# Patient Record
Sex: Female | Born: 1940
Health system: Southern US, Community
[De-identification: ages and names within clinical notes are randomized; demographics above are authoritative.]

## PROBLEM LIST (undated history)

## (undated) DIAGNOSIS — F419 Anxiety disorder, unspecified: Secondary | ICD-10-CM

## (undated) DIAGNOSIS — F329 Major depressive disorder, single episode, unspecified: Secondary | ICD-10-CM

## (undated) DIAGNOSIS — E876 Hypokalemia: Secondary | ICD-10-CM

## (undated) DIAGNOSIS — Z9181 History of falling: Secondary | ICD-10-CM

## (undated) DIAGNOSIS — E569 Vitamin deficiency, unspecified: Secondary | ICD-10-CM

## (undated) DIAGNOSIS — M797 Fibromyalgia: Secondary | ICD-10-CM

## (undated) DIAGNOSIS — K219 Gastro-esophageal reflux disease without esophagitis: Secondary | ICD-10-CM

## (undated) DIAGNOSIS — M069 Rheumatoid arthritis, unspecified: Secondary | ICD-10-CM

## (undated) DIAGNOSIS — R262 Difficulty in walking, not elsewhere classified: Secondary | ICD-10-CM

## (undated) DIAGNOSIS — K449 Diaphragmatic hernia without obstruction or gangrene: Secondary | ICD-10-CM

## (undated) DIAGNOSIS — G9340 Encephalopathy, unspecified: Secondary | ICD-10-CM

## (undated) DIAGNOSIS — K5900 Constipation, unspecified: Secondary | ICD-10-CM

## (undated) DIAGNOSIS — H04123 Dry eye syndrome of bilateral lacrimal glands: Secondary | ICD-10-CM

## (undated) DIAGNOSIS — K649 Unspecified hemorrhoids: Secondary | ICD-10-CM

## (undated) DIAGNOSIS — G5792 Unspecified mononeuropathy of left lower limb: Secondary | ICD-10-CM

## (undated) DIAGNOSIS — I10 Essential (primary) hypertension: Secondary | ICD-10-CM

## (undated) HISTORY — DX: Gastro-esophageal reflux disease without esophagitis: K21.9

## (undated) HISTORY — DX: Rheumatoid arthritis, unspecified: M06.9

## (undated) HISTORY — DX: Anxiety disorder, unspecified: F41.9

## (undated) HISTORY — DX: Unspecified hemorrhoids: K64.9

## (undated) HISTORY — DX: Essential (primary) hypertension: I10

## (undated) HISTORY — DX: Constipation, unspecified: K59.00

## (undated) HISTORY — DX: Dry eye syndrome of bilateral lacrimal glands: H04.123

## (undated) HISTORY — DX: Hypokalemia: E87.6

## (undated) HISTORY — DX: Fibromyalgia: M79.7

## (undated) HISTORY — DX: Vitamin deficiency, unspecified: E56.9

## (undated) HISTORY — DX: Unspecified mononeuropathy of left lower limb: G57.92

## (undated) HISTORY — DX: Difficulty in walking, not elsewhere classified: R26.2

## (undated) HISTORY — DX: Encephalopathy, unspecified: G93.40

## (undated) HISTORY — DX: Diaphragmatic hernia without obstruction or gangrene: K44.9

## (undated) HISTORY — DX: Major depressive disorder, single episode, unspecified: F32.9

## (undated) HISTORY — DX: History of falling: Z91.81

---

## 2016-08-26 DIAGNOSIS — F321 Major depressive disorder, single episode, moderate: Secondary | ICD-10-CM | POA: Diagnosis not present

## 2016-08-26 DIAGNOSIS — F411 Generalized anxiety disorder: Secondary | ICD-10-CM | POA: Diagnosis not present

## 2016-09-03 DIAGNOSIS — F411 Generalized anxiety disorder: Secondary | ICD-10-CM | POA: Diagnosis not present

## 2016-09-03 DIAGNOSIS — F321 Major depressive disorder, single episode, moderate: Secondary | ICD-10-CM | POA: Diagnosis not present

## 2016-09-10 DIAGNOSIS — F411 Generalized anxiety disorder: Secondary | ICD-10-CM | POA: Diagnosis not present

## 2016-09-10 DIAGNOSIS — F321 Major depressive disorder, single episode, moderate: Secondary | ICD-10-CM | POA: Diagnosis not present

## 2016-09-17 DIAGNOSIS — M0689 Other specified rheumatoid arthritis, multiple sites: Secondary | ICD-10-CM | POA: Diagnosis not present

## 2016-09-17 DIAGNOSIS — K219 Gastro-esophageal reflux disease without esophagitis: Secondary | ICD-10-CM | POA: Diagnosis not present

## 2016-09-17 DIAGNOSIS — G301 Alzheimer's disease with late onset: Secondary | ICD-10-CM | POA: Diagnosis not present

## 2016-09-17 DIAGNOSIS — I10 Essential (primary) hypertension: Secondary | ICD-10-CM | POA: Diagnosis not present

## 2016-09-18 DIAGNOSIS — I10 Essential (primary) hypertension: Secondary | ICD-10-CM | POA: Diagnosis not present

## 2016-09-18 DIAGNOSIS — F331 Major depressive disorder, recurrent, moderate: Secondary | ICD-10-CM | POA: Diagnosis not present

## 2016-09-18 DIAGNOSIS — M0579 Rheumatoid arthritis with rheumatoid factor of multiple sites without organ or systems involvement: Secondary | ICD-10-CM | POA: Diagnosis not present

## 2016-09-18 DIAGNOSIS — F0150 Vascular dementia without behavioral disturbance: Secondary | ICD-10-CM | POA: Diagnosis not present

## 2016-09-19 DIAGNOSIS — M6281 Muscle weakness (generalized): Secondary | ICD-10-CM | POA: Diagnosis not present

## 2016-09-20 DIAGNOSIS — M6281 Muscle weakness (generalized): Secondary | ICD-10-CM | POA: Diagnosis not present

## 2016-09-21 DIAGNOSIS — M6281 Muscle weakness (generalized): Secondary | ICD-10-CM | POA: Diagnosis not present

## 2016-09-22 DIAGNOSIS — M6281 Muscle weakness (generalized): Secondary | ICD-10-CM | POA: Diagnosis not present

## 2016-09-23 DIAGNOSIS — M6281 Muscle weakness (generalized): Secondary | ICD-10-CM | POA: Diagnosis not present

## 2016-09-24 DIAGNOSIS — M6281 Muscle weakness (generalized): Secondary | ICD-10-CM | POA: Diagnosis not present

## 2016-09-27 DIAGNOSIS — M6281 Muscle weakness (generalized): Secondary | ICD-10-CM | POA: Diagnosis not present

## 2016-09-28 DIAGNOSIS — M6281 Muscle weakness (generalized): Secondary | ICD-10-CM | POA: Diagnosis not present

## 2016-09-29 DIAGNOSIS — M6281 Muscle weakness (generalized): Secondary | ICD-10-CM | POA: Diagnosis not present

## 2016-09-30 DIAGNOSIS — M6281 Muscle weakness (generalized): Secondary | ICD-10-CM | POA: Diagnosis not present

## 2016-10-01 DIAGNOSIS — M6281 Muscle weakness (generalized): Secondary | ICD-10-CM | POA: Diagnosis not present

## 2016-10-02 DIAGNOSIS — M6281 Muscle weakness (generalized): Secondary | ICD-10-CM | POA: Diagnosis not present

## 2016-10-04 DIAGNOSIS — M6281 Muscle weakness (generalized): Secondary | ICD-10-CM | POA: Diagnosis not present

## 2016-10-05 DIAGNOSIS — M6281 Muscle weakness (generalized): Secondary | ICD-10-CM | POA: Diagnosis not present

## 2016-10-06 DIAGNOSIS — M6281 Muscle weakness (generalized): Secondary | ICD-10-CM | POA: Diagnosis not present

## 2016-10-06 DIAGNOSIS — I951 Orthostatic hypotension: Secondary | ICD-10-CM | POA: Diagnosis not present

## 2016-10-07 DIAGNOSIS — M6281 Muscle weakness (generalized): Secondary | ICD-10-CM | POA: Diagnosis not present

## 2016-10-08 DIAGNOSIS — M6281 Muscle weakness (generalized): Secondary | ICD-10-CM | POA: Diagnosis not present

## 2016-10-08 DIAGNOSIS — J9811 Atelectasis: Secondary | ICD-10-CM | POA: Diagnosis not present

## 2016-10-11 DIAGNOSIS — M6281 Muscle weakness (generalized): Secondary | ICD-10-CM | POA: Diagnosis not present

## 2016-10-12 DIAGNOSIS — M6281 Muscle weakness (generalized): Secondary | ICD-10-CM | POA: Diagnosis not present

## 2016-10-13 DIAGNOSIS — M6281 Muscle weakness (generalized): Secondary | ICD-10-CM | POA: Diagnosis not present

## 2016-10-13 DIAGNOSIS — M0680 Other specified rheumatoid arthritis, unspecified site: Secondary | ICD-10-CM | POA: Diagnosis not present

## 2016-10-14 DIAGNOSIS — M6281 Muscle weakness (generalized): Secondary | ICD-10-CM | POA: Diagnosis not present

## 2016-10-15 DIAGNOSIS — M6281 Muscle weakness (generalized): Secondary | ICD-10-CM | POA: Diagnosis not present

## 2016-10-18 DIAGNOSIS — M6281 Muscle weakness (generalized): Secondary | ICD-10-CM | POA: Diagnosis not present

## 2016-10-19 DIAGNOSIS — M6281 Muscle weakness (generalized): Secondary | ICD-10-CM | POA: Diagnosis not present

## 2016-10-20 DIAGNOSIS — M6281 Muscle weakness (generalized): Secondary | ICD-10-CM | POA: Diagnosis not present

## 2016-10-20 DIAGNOSIS — M069 Rheumatoid arthritis, unspecified: Secondary | ICD-10-CM | POA: Diagnosis not present

## 2016-10-21 DIAGNOSIS — M6281 Muscle weakness (generalized): Secondary | ICD-10-CM | POA: Diagnosis not present

## 2016-10-22 DIAGNOSIS — M6281 Muscle weakness (generalized): Secondary | ICD-10-CM | POA: Diagnosis not present

## 2016-10-22 DIAGNOSIS — H04123 Dry eye syndrome of bilateral lacrimal glands: Secondary | ICD-10-CM | POA: Diagnosis not present

## 2016-10-25 DIAGNOSIS — M6281 Muscle weakness (generalized): Secondary | ICD-10-CM | POA: Diagnosis not present

## 2016-10-26 DIAGNOSIS — M6281 Muscle weakness (generalized): Secondary | ICD-10-CM | POA: Diagnosis not present

## 2016-10-27 DIAGNOSIS — M6281 Muscle weakness (generalized): Secondary | ICD-10-CM | POA: Diagnosis not present

## 2016-10-29 DIAGNOSIS — M6281 Muscle weakness (generalized): Secondary | ICD-10-CM | POA: Diagnosis not present

## 2016-10-30 DIAGNOSIS — M6281 Muscle weakness (generalized): Secondary | ICD-10-CM | POA: Diagnosis not present

## 2016-11-02 DIAGNOSIS — M6281 Muscle weakness (generalized): Secondary | ICD-10-CM | POA: Diagnosis not present

## 2016-11-03 DIAGNOSIS — M6281 Muscle weakness (generalized): Secondary | ICD-10-CM | POA: Diagnosis not present

## 2016-11-04 DIAGNOSIS — M6281 Muscle weakness (generalized): Secondary | ICD-10-CM | POA: Diagnosis not present

## 2016-11-06 DIAGNOSIS — M6281 Muscle weakness (generalized): Secondary | ICD-10-CM | POA: Diagnosis not present

## 2016-11-09 DIAGNOSIS — M6281 Muscle weakness (generalized): Secondary | ICD-10-CM | POA: Diagnosis not present

## 2016-11-10 DIAGNOSIS — M6281 Muscle weakness (generalized): Secondary | ICD-10-CM | POA: Diagnosis not present

## 2016-11-10 DIAGNOSIS — L298 Other pruritus: Secondary | ICD-10-CM | POA: Diagnosis not present

## 2016-12-15 DIAGNOSIS — R319 Hematuria, unspecified: Secondary | ICD-10-CM | POA: Diagnosis not present

## 2016-12-15 DIAGNOSIS — N390 Urinary tract infection, site not specified: Secondary | ICD-10-CM | POA: Diagnosis not present

## 2016-12-15 DIAGNOSIS — Z79899 Other long term (current) drug therapy: Secondary | ICD-10-CM | POA: Diagnosis not present

## 2016-12-28 DIAGNOSIS — M6281 Muscle weakness (generalized): Secondary | ICD-10-CM | POA: Diagnosis not present

## 2016-12-28 DIAGNOSIS — M069 Rheumatoid arthritis, unspecified: Secondary | ICD-10-CM | POA: Diagnosis not present

## 2016-12-28 DIAGNOSIS — M797 Fibromyalgia: Secondary | ICD-10-CM | POA: Diagnosis not present

## 2016-12-29 DIAGNOSIS — M6281 Muscle weakness (generalized): Secondary | ICD-10-CM | POA: Diagnosis not present

## 2016-12-29 DIAGNOSIS — M069 Rheumatoid arthritis, unspecified: Secondary | ICD-10-CM | POA: Diagnosis not present

## 2016-12-29 DIAGNOSIS — M797 Fibromyalgia: Secondary | ICD-10-CM | POA: Diagnosis not present

## 2016-12-30 DIAGNOSIS — M6281 Muscle weakness (generalized): Secondary | ICD-10-CM | POA: Diagnosis not present

## 2016-12-30 DIAGNOSIS — M797 Fibromyalgia: Secondary | ICD-10-CM | POA: Diagnosis not present

## 2016-12-30 DIAGNOSIS — M069 Rheumatoid arthritis, unspecified: Secondary | ICD-10-CM | POA: Diagnosis not present

## 2017-01-01 DIAGNOSIS — M069 Rheumatoid arthritis, unspecified: Secondary | ICD-10-CM | POA: Diagnosis not present

## 2017-01-01 DIAGNOSIS — M797 Fibromyalgia: Secondary | ICD-10-CM | POA: Diagnosis not present

## 2017-01-01 DIAGNOSIS — M6281 Muscle weakness (generalized): Secondary | ICD-10-CM | POA: Diagnosis not present

## 2017-01-03 DIAGNOSIS — M797 Fibromyalgia: Secondary | ICD-10-CM | POA: Diagnosis not present

## 2017-01-03 DIAGNOSIS — M6281 Muscle weakness (generalized): Secondary | ICD-10-CM | POA: Diagnosis not present

## 2017-01-03 DIAGNOSIS — M069 Rheumatoid arthritis, unspecified: Secondary | ICD-10-CM | POA: Diagnosis not present

## 2017-01-04 DIAGNOSIS — M797 Fibromyalgia: Secondary | ICD-10-CM | POA: Diagnosis not present

## 2017-01-04 DIAGNOSIS — M069 Rheumatoid arthritis, unspecified: Secondary | ICD-10-CM | POA: Diagnosis not present

## 2017-01-04 DIAGNOSIS — M6281 Muscle weakness (generalized): Secondary | ICD-10-CM | POA: Diagnosis not present

## 2017-01-05 DIAGNOSIS — M797 Fibromyalgia: Secondary | ICD-10-CM | POA: Diagnosis not present

## 2017-01-05 DIAGNOSIS — M6281 Muscle weakness (generalized): Secondary | ICD-10-CM | POA: Diagnosis not present

## 2017-01-05 DIAGNOSIS — M069 Rheumatoid arthritis, unspecified: Secondary | ICD-10-CM | POA: Diagnosis not present

## 2017-01-06 DIAGNOSIS — M069 Rheumatoid arthritis, unspecified: Secondary | ICD-10-CM | POA: Diagnosis not present

## 2017-01-06 DIAGNOSIS — M6281 Muscle weakness (generalized): Secondary | ICD-10-CM | POA: Diagnosis not present

## 2017-01-06 DIAGNOSIS — M797 Fibromyalgia: Secondary | ICD-10-CM | POA: Diagnosis not present

## 2017-01-07 DIAGNOSIS — M069 Rheumatoid arthritis, unspecified: Secondary | ICD-10-CM | POA: Diagnosis not present

## 2017-01-07 DIAGNOSIS — M2550 Pain in unspecified joint: Secondary | ICD-10-CM | POA: Diagnosis not present

## 2017-01-07 DIAGNOSIS — M797 Fibromyalgia: Secondary | ICD-10-CM | POA: Diagnosis not present

## 2017-01-07 DIAGNOSIS — F0280 Dementia in other diseases classified elsewhere without behavioral disturbance: Secondary | ICD-10-CM | POA: Diagnosis not present

## 2017-01-07 DIAGNOSIS — M0579 Rheumatoid arthritis with rheumatoid factor of multiple sites without organ or systems involvement: Secondary | ICD-10-CM | POA: Diagnosis not present

## 2017-01-07 DIAGNOSIS — Z79899 Other long term (current) drug therapy: Secondary | ICD-10-CM | POA: Diagnosis not present

## 2017-01-07 DIAGNOSIS — R5383 Other fatigue: Secondary | ICD-10-CM | POA: Diagnosis not present

## 2017-01-07 DIAGNOSIS — M6281 Muscle weakness (generalized): Secondary | ICD-10-CM | POA: Diagnosis not present

## 2017-01-08 DIAGNOSIS — M069 Rheumatoid arthritis, unspecified: Secondary | ICD-10-CM | POA: Diagnosis not present

## 2017-01-08 DIAGNOSIS — M797 Fibromyalgia: Secondary | ICD-10-CM | POA: Diagnosis not present

## 2017-01-08 DIAGNOSIS — M6281 Muscle weakness (generalized): Secondary | ICD-10-CM | POA: Diagnosis not present

## 2017-01-10 DIAGNOSIS — M6281 Muscle weakness (generalized): Secondary | ICD-10-CM | POA: Diagnosis not present

## 2017-01-10 DIAGNOSIS — M797 Fibromyalgia: Secondary | ICD-10-CM | POA: Diagnosis not present

## 2017-01-10 DIAGNOSIS — M069 Rheumatoid arthritis, unspecified: Secondary | ICD-10-CM | POA: Diagnosis not present

## 2017-01-11 DIAGNOSIS — M797 Fibromyalgia: Secondary | ICD-10-CM | POA: Diagnosis not present

## 2017-01-11 DIAGNOSIS — M6281 Muscle weakness (generalized): Secondary | ICD-10-CM | POA: Diagnosis not present

## 2017-01-11 DIAGNOSIS — M069 Rheumatoid arthritis, unspecified: Secondary | ICD-10-CM | POA: Diagnosis not present

## 2017-01-12 DIAGNOSIS — M6281 Muscle weakness (generalized): Secondary | ICD-10-CM | POA: Diagnosis not present

## 2017-01-12 DIAGNOSIS — M069 Rheumatoid arthritis, unspecified: Secondary | ICD-10-CM | POA: Diagnosis not present

## 2017-01-12 DIAGNOSIS — M797 Fibromyalgia: Secondary | ICD-10-CM | POA: Diagnosis not present

## 2017-01-14 DIAGNOSIS — M069 Rheumatoid arthritis, unspecified: Secondary | ICD-10-CM | POA: Diagnosis not present

## 2017-01-14 DIAGNOSIS — M6281 Muscle weakness (generalized): Secondary | ICD-10-CM | POA: Diagnosis not present

## 2017-01-14 DIAGNOSIS — M797 Fibromyalgia: Secondary | ICD-10-CM | POA: Diagnosis not present

## 2017-01-16 DIAGNOSIS — M6281 Muscle weakness (generalized): Secondary | ICD-10-CM | POA: Diagnosis not present

## 2017-01-16 DIAGNOSIS — M797 Fibromyalgia: Secondary | ICD-10-CM | POA: Diagnosis not present

## 2017-01-16 DIAGNOSIS — M069 Rheumatoid arthritis, unspecified: Secondary | ICD-10-CM | POA: Diagnosis not present

## 2017-01-17 DIAGNOSIS — M797 Fibromyalgia: Secondary | ICD-10-CM | POA: Diagnosis not present

## 2017-01-17 DIAGNOSIS — M069 Rheumatoid arthritis, unspecified: Secondary | ICD-10-CM | POA: Diagnosis not present

## 2017-01-17 DIAGNOSIS — M6281 Muscle weakness (generalized): Secondary | ICD-10-CM | POA: Diagnosis not present

## 2017-01-19 DIAGNOSIS — M069 Rheumatoid arthritis, unspecified: Secondary | ICD-10-CM | POA: Diagnosis not present

## 2017-01-19 DIAGNOSIS — M797 Fibromyalgia: Secondary | ICD-10-CM | POA: Diagnosis not present

## 2017-01-19 DIAGNOSIS — M6281 Muscle weakness (generalized): Secondary | ICD-10-CM | POA: Diagnosis not present

## 2017-01-20 DIAGNOSIS — M069 Rheumatoid arthritis, unspecified: Secondary | ICD-10-CM | POA: Diagnosis not present

## 2017-01-20 DIAGNOSIS — M6281 Muscle weakness (generalized): Secondary | ICD-10-CM | POA: Diagnosis not present

## 2017-01-20 DIAGNOSIS — M797 Fibromyalgia: Secondary | ICD-10-CM | POA: Diagnosis not present

## 2017-01-21 DIAGNOSIS — M6281 Muscle weakness (generalized): Secondary | ICD-10-CM | POA: Diagnosis not present

## 2017-01-21 DIAGNOSIS — M069 Rheumatoid arthritis, unspecified: Secondary | ICD-10-CM | POA: Diagnosis not present

## 2017-01-21 DIAGNOSIS — M797 Fibromyalgia: Secondary | ICD-10-CM | POA: Diagnosis not present

## 2017-01-24 DIAGNOSIS — M6281 Muscle weakness (generalized): Secondary | ICD-10-CM | POA: Diagnosis not present

## 2017-01-24 DIAGNOSIS — M069 Rheumatoid arthritis, unspecified: Secondary | ICD-10-CM | POA: Diagnosis not present

## 2017-01-24 DIAGNOSIS — M797 Fibromyalgia: Secondary | ICD-10-CM | POA: Diagnosis not present

## 2017-01-25 DIAGNOSIS — M069 Rheumatoid arthritis, unspecified: Secondary | ICD-10-CM | POA: Diagnosis not present

## 2017-01-25 DIAGNOSIS — M6281 Muscle weakness (generalized): Secondary | ICD-10-CM | POA: Diagnosis not present

## 2017-01-25 DIAGNOSIS — M797 Fibromyalgia: Secondary | ICD-10-CM | POA: Diagnosis not present

## 2017-01-27 DIAGNOSIS — M069 Rheumatoid arthritis, unspecified: Secondary | ICD-10-CM | POA: Diagnosis not present

## 2017-01-27 DIAGNOSIS — M6281 Muscle weakness (generalized): Secondary | ICD-10-CM | POA: Diagnosis not present

## 2017-01-27 DIAGNOSIS — M797 Fibromyalgia: Secondary | ICD-10-CM | POA: Diagnosis not present

## 2017-01-28 DIAGNOSIS — M797 Fibromyalgia: Secondary | ICD-10-CM | POA: Diagnosis not present

## 2017-01-28 DIAGNOSIS — M069 Rheumatoid arthritis, unspecified: Secondary | ICD-10-CM | POA: Diagnosis not present

## 2017-01-28 DIAGNOSIS — M6281 Muscle weakness (generalized): Secondary | ICD-10-CM | POA: Diagnosis not present

## 2017-02-01 DIAGNOSIS — M069 Rheumatoid arthritis, unspecified: Secondary | ICD-10-CM | POA: Diagnosis not present

## 2017-02-01 DIAGNOSIS — M6281 Muscle weakness (generalized): Secondary | ICD-10-CM | POA: Diagnosis not present

## 2017-02-01 DIAGNOSIS — M797 Fibromyalgia: Secondary | ICD-10-CM | POA: Diagnosis not present

## 2017-02-02 DIAGNOSIS — M797 Fibromyalgia: Secondary | ICD-10-CM | POA: Diagnosis not present

## 2017-02-02 DIAGNOSIS — M069 Rheumatoid arthritis, unspecified: Secondary | ICD-10-CM | POA: Diagnosis not present

## 2017-02-02 DIAGNOSIS — M6281 Muscle weakness (generalized): Secondary | ICD-10-CM | POA: Diagnosis not present

## 2017-02-03 DIAGNOSIS — M6281 Muscle weakness (generalized): Secondary | ICD-10-CM | POA: Diagnosis not present

## 2017-02-03 DIAGNOSIS — M797 Fibromyalgia: Secondary | ICD-10-CM | POA: Diagnosis not present

## 2017-02-03 DIAGNOSIS — M069 Rheumatoid arthritis, unspecified: Secondary | ICD-10-CM | POA: Diagnosis not present

## 2017-02-07 DIAGNOSIS — M797 Fibromyalgia: Secondary | ICD-10-CM | POA: Diagnosis not present

## 2017-02-07 DIAGNOSIS — M6281 Muscle weakness (generalized): Secondary | ICD-10-CM | POA: Diagnosis not present

## 2017-02-07 DIAGNOSIS — M069 Rheumatoid arthritis, unspecified: Secondary | ICD-10-CM | POA: Diagnosis not present

## 2017-02-11 DIAGNOSIS — M6281 Muscle weakness (generalized): Secondary | ICD-10-CM | POA: Diagnosis not present

## 2017-02-11 DIAGNOSIS — M797 Fibromyalgia: Secondary | ICD-10-CM | POA: Diagnosis not present

## 2017-02-11 DIAGNOSIS — M069 Rheumatoid arthritis, unspecified: Secondary | ICD-10-CM | POA: Diagnosis not present

## 2017-02-13 DIAGNOSIS — M069 Rheumatoid arthritis, unspecified: Secondary | ICD-10-CM | POA: Diagnosis not present

## 2017-02-13 DIAGNOSIS — M797 Fibromyalgia: Secondary | ICD-10-CM | POA: Diagnosis not present

## 2017-02-13 DIAGNOSIS — M6281 Muscle weakness (generalized): Secondary | ICD-10-CM | POA: Diagnosis not present

## 2017-02-16 DIAGNOSIS — H2513 Age-related nuclear cataract, bilateral: Secondary | ICD-10-CM | POA: Diagnosis not present

## 2017-02-16 DIAGNOSIS — Z79899 Other long term (current) drug therapy: Secondary | ICD-10-CM | POA: Diagnosis not present

## 2017-03-07 DIAGNOSIS — R319 Hematuria, unspecified: Secondary | ICD-10-CM | POA: Diagnosis not present

## 2017-03-07 DIAGNOSIS — N390 Urinary tract infection, site not specified: Secondary | ICD-10-CM | POA: Diagnosis not present

## 2017-03-22 DIAGNOSIS — M797 Fibromyalgia: Secondary | ICD-10-CM | POA: Diagnosis not present

## 2017-03-22 DIAGNOSIS — M069 Rheumatoid arthritis, unspecified: Secondary | ICD-10-CM | POA: Diagnosis not present

## 2017-03-22 DIAGNOSIS — I10 Essential (primary) hypertension: Secondary | ICD-10-CM | POA: Diagnosis not present

## 2017-03-22 DIAGNOSIS — K219 Gastro-esophageal reflux disease without esophagitis: Secondary | ICD-10-CM | POA: Diagnosis not present

## 2017-03-22 DIAGNOSIS — M6281 Muscle weakness (generalized): Secondary | ICD-10-CM | POA: Diagnosis not present

## 2017-03-23 DIAGNOSIS — E039 Hypothyroidism, unspecified: Secondary | ICD-10-CM | POA: Diagnosis not present

## 2017-03-23 DIAGNOSIS — I509 Heart failure, unspecified: Secondary | ICD-10-CM | POA: Diagnosis not present

## 2017-03-23 DIAGNOSIS — I10 Essential (primary) hypertension: Secondary | ICD-10-CM | POA: Diagnosis not present

## 2017-03-23 DIAGNOSIS — D649 Anemia, unspecified: Secondary | ICD-10-CM | POA: Diagnosis not present

## 2017-04-20 DIAGNOSIS — M0579 Rheumatoid arthritis with rheumatoid factor of multiple sites without organ or systems involvement: Secondary | ICD-10-CM | POA: Diagnosis not present

## 2017-04-20 DIAGNOSIS — M154 Erosive (osteo)arthritis: Secondary | ICD-10-CM | POA: Diagnosis not present

## 2017-04-20 DIAGNOSIS — M2550 Pain in unspecified joint: Secondary | ICD-10-CM | POA: Diagnosis not present

## 2017-04-20 DIAGNOSIS — Z79899 Other long term (current) drug therapy: Secondary | ICD-10-CM | POA: Diagnosis not present

## 2017-04-20 DIAGNOSIS — M150 Primary generalized (osteo)arthritis: Secondary | ICD-10-CM | POA: Diagnosis not present

## 2017-04-21 DIAGNOSIS — F339 Major depressive disorder, recurrent, unspecified: Secondary | ICD-10-CM | POA: Diagnosis not present

## 2017-04-28 DIAGNOSIS — M069 Rheumatoid arthritis, unspecified: Secondary | ICD-10-CM | POA: Diagnosis not present

## 2017-04-28 DIAGNOSIS — M6281 Muscle weakness (generalized): Secondary | ICD-10-CM | POA: Diagnosis not present

## 2017-04-28 DIAGNOSIS — R293 Abnormal posture: Secondary | ICD-10-CM | POA: Diagnosis not present

## 2017-05-03 DIAGNOSIS — M069 Rheumatoid arthritis, unspecified: Secondary | ICD-10-CM | POA: Diagnosis not present

## 2017-05-03 DIAGNOSIS — M6281 Muscle weakness (generalized): Secondary | ICD-10-CM | POA: Diagnosis not present

## 2017-05-03 DIAGNOSIS — R293 Abnormal posture: Secondary | ICD-10-CM | POA: Diagnosis not present

## 2017-05-05 DIAGNOSIS — M6281 Muscle weakness (generalized): Secondary | ICD-10-CM | POA: Diagnosis not present

## 2017-05-05 DIAGNOSIS — M069 Rheumatoid arthritis, unspecified: Secondary | ICD-10-CM | POA: Diagnosis not present

## 2017-05-05 DIAGNOSIS — R293 Abnormal posture: Secondary | ICD-10-CM | POA: Diagnosis not present

## 2017-05-10 DIAGNOSIS — M6281 Muscle weakness (generalized): Secondary | ICD-10-CM | POA: Diagnosis not present

## 2017-05-10 DIAGNOSIS — R293 Abnormal posture: Secondary | ICD-10-CM | POA: Diagnosis not present

## 2017-05-10 DIAGNOSIS — M069 Rheumatoid arthritis, unspecified: Secondary | ICD-10-CM | POA: Diagnosis not present

## 2017-05-11 DIAGNOSIS — M6281 Muscle weakness (generalized): Secondary | ICD-10-CM | POA: Diagnosis not present

## 2017-05-11 DIAGNOSIS — R293 Abnormal posture: Secondary | ICD-10-CM | POA: Diagnosis not present

## 2017-05-11 DIAGNOSIS — M069 Rheumatoid arthritis, unspecified: Secondary | ICD-10-CM | POA: Diagnosis not present

## 2017-05-17 DIAGNOSIS — M069 Rheumatoid arthritis, unspecified: Secondary | ICD-10-CM | POA: Diagnosis not present

## 2017-05-17 DIAGNOSIS — R293 Abnormal posture: Secondary | ICD-10-CM | POA: Diagnosis not present

## 2017-05-17 DIAGNOSIS — M6281 Muscle weakness (generalized): Secondary | ICD-10-CM | POA: Diagnosis not present

## 2017-05-18 DIAGNOSIS — M069 Rheumatoid arthritis, unspecified: Secondary | ICD-10-CM | POA: Diagnosis not present

## 2017-05-18 DIAGNOSIS — R293 Abnormal posture: Secondary | ICD-10-CM | POA: Diagnosis not present

## 2017-05-18 DIAGNOSIS — M6281 Muscle weakness (generalized): Secondary | ICD-10-CM | POA: Diagnosis not present

## 2017-05-24 DIAGNOSIS — M069 Rheumatoid arthritis, unspecified: Secondary | ICD-10-CM | POA: Diagnosis not present

## 2017-05-24 DIAGNOSIS — M6281 Muscle weakness (generalized): Secondary | ICD-10-CM | POA: Diagnosis not present

## 2017-05-24 DIAGNOSIS — R293 Abnormal posture: Secondary | ICD-10-CM | POA: Diagnosis not present

## 2017-05-25 DIAGNOSIS — R293 Abnormal posture: Secondary | ICD-10-CM | POA: Diagnosis not present

## 2017-05-25 DIAGNOSIS — M6281 Muscle weakness (generalized): Secondary | ICD-10-CM | POA: Diagnosis not present

## 2017-05-25 DIAGNOSIS — M069 Rheumatoid arthritis, unspecified: Secondary | ICD-10-CM | POA: Diagnosis not present

## 2017-05-30 DIAGNOSIS — F3289 Other specified depressive episodes: Secondary | ICD-10-CM | POA: Diagnosis not present

## 2017-06-07 DIAGNOSIS — M069 Rheumatoid arthritis, unspecified: Secondary | ICD-10-CM | POA: Diagnosis not present

## 2017-06-07 DIAGNOSIS — K5900 Constipation, unspecified: Secondary | ICD-10-CM | POA: Diagnosis not present

## 2017-06-07 DIAGNOSIS — R05 Cough: Secondary | ICD-10-CM | POA: Diagnosis not present

## 2017-06-07 DIAGNOSIS — E569 Vitamin deficiency, unspecified: Secondary | ICD-10-CM | POA: Diagnosis not present

## 2017-06-07 DIAGNOSIS — M797 Fibromyalgia: Secondary | ICD-10-CM | POA: Diagnosis not present

## 2017-06-07 DIAGNOSIS — H04129 Dry eye syndrome of unspecified lacrimal gland: Secondary | ICD-10-CM | POA: Diagnosis not present

## 2017-06-07 DIAGNOSIS — R197 Diarrhea, unspecified: Secondary | ICD-10-CM | POA: Diagnosis not present

## 2017-06-07 DIAGNOSIS — F419 Anxiety disorder, unspecified: Secondary | ICD-10-CM | POA: Diagnosis not present

## 2017-06-07 DIAGNOSIS — R54 Age-related physical debility: Secondary | ICD-10-CM | POA: Diagnosis not present

## 2017-06-07 DIAGNOSIS — B379 Candidiasis, unspecified: Secondary | ICD-10-CM | POA: Diagnosis not present

## 2017-06-07 DIAGNOSIS — I10 Essential (primary) hypertension: Secondary | ICD-10-CM | POA: Diagnosis not present

## 2017-06-07 DIAGNOSIS — E876 Hypokalemia: Secondary | ICD-10-CM | POA: Diagnosis not present

## 2017-06-21 DIAGNOSIS — E569 Vitamin deficiency, unspecified: Secondary | ICD-10-CM | POA: Diagnosis not present

## 2017-06-21 DIAGNOSIS — R110 Nausea: Secondary | ICD-10-CM | POA: Diagnosis not present

## 2017-06-21 DIAGNOSIS — R197 Diarrhea, unspecified: Secondary | ICD-10-CM | POA: Diagnosis not present

## 2017-06-21 DIAGNOSIS — K5900 Constipation, unspecified: Secondary | ICD-10-CM | POA: Diagnosis not present

## 2017-06-21 DIAGNOSIS — B379 Candidiasis, unspecified: Secondary | ICD-10-CM | POA: Diagnosis not present

## 2017-06-21 DIAGNOSIS — E876 Hypokalemia: Secondary | ICD-10-CM | POA: Diagnosis not present

## 2017-06-21 DIAGNOSIS — M069 Rheumatoid arthritis, unspecified: Secondary | ICD-10-CM | POA: Diagnosis not present

## 2017-06-21 DIAGNOSIS — R05 Cough: Secondary | ICD-10-CM | POA: Diagnosis not present

## 2017-06-21 DIAGNOSIS — H04129 Dry eye syndrome of unspecified lacrimal gland: Secondary | ICD-10-CM | POA: Diagnosis not present

## 2017-06-21 DIAGNOSIS — M797 Fibromyalgia: Secondary | ICD-10-CM | POA: Diagnosis not present

## 2017-06-21 DIAGNOSIS — I10 Essential (primary) hypertension: Secondary | ICD-10-CM | POA: Diagnosis not present

## 2017-06-21 DIAGNOSIS — M6281 Muscle weakness (generalized): Secondary | ICD-10-CM | POA: Diagnosis not present

## 2017-06-24 DIAGNOSIS — R262 Difficulty in walking, not elsewhere classified: Secondary | ICD-10-CM | POA: Diagnosis not present

## 2017-06-24 DIAGNOSIS — F0390 Unspecified dementia without behavioral disturbance: Secondary | ICD-10-CM | POA: Diagnosis not present

## 2017-06-27 DIAGNOSIS — F0390 Unspecified dementia without behavioral disturbance: Secondary | ICD-10-CM | POA: Diagnosis not present

## 2017-06-27 DIAGNOSIS — R262 Difficulty in walking, not elsewhere classified: Secondary | ICD-10-CM | POA: Diagnosis not present

## 2017-06-28 DIAGNOSIS — F0390 Unspecified dementia without behavioral disturbance: Secondary | ICD-10-CM | POA: Diagnosis not present

## 2017-06-28 DIAGNOSIS — R262 Difficulty in walking, not elsewhere classified: Secondary | ICD-10-CM | POA: Diagnosis not present

## 2017-06-29 DIAGNOSIS — F0390 Unspecified dementia without behavioral disturbance: Secondary | ICD-10-CM | POA: Diagnosis not present

## 2017-06-29 DIAGNOSIS — R262 Difficulty in walking, not elsewhere classified: Secondary | ICD-10-CM | POA: Diagnosis not present

## 2017-06-30 DIAGNOSIS — F0390 Unspecified dementia without behavioral disturbance: Secondary | ICD-10-CM | POA: Diagnosis not present

## 2017-06-30 DIAGNOSIS — R262 Difficulty in walking, not elsewhere classified: Secondary | ICD-10-CM | POA: Diagnosis not present

## 2017-07-01 DIAGNOSIS — R262 Difficulty in walking, not elsewhere classified: Secondary | ICD-10-CM | POA: Diagnosis not present

## 2017-07-01 DIAGNOSIS — F0390 Unspecified dementia without behavioral disturbance: Secondary | ICD-10-CM | POA: Diagnosis not present

## 2017-07-04 DIAGNOSIS — F0390 Unspecified dementia without behavioral disturbance: Secondary | ICD-10-CM | POA: Diagnosis not present

## 2017-07-04 DIAGNOSIS — R262 Difficulty in walking, not elsewhere classified: Secondary | ICD-10-CM | POA: Diagnosis not present

## 2017-07-05 DIAGNOSIS — R262 Difficulty in walking, not elsewhere classified: Secondary | ICD-10-CM | POA: Diagnosis not present

## 2017-07-05 DIAGNOSIS — F0390 Unspecified dementia without behavioral disturbance: Secondary | ICD-10-CM | POA: Diagnosis not present

## 2017-07-06 DIAGNOSIS — F0390 Unspecified dementia without behavioral disturbance: Secondary | ICD-10-CM | POA: Diagnosis not present

## 2017-07-06 DIAGNOSIS — R262 Difficulty in walking, not elsewhere classified: Secondary | ICD-10-CM | POA: Diagnosis not present

## 2017-07-07 DIAGNOSIS — F0390 Unspecified dementia without behavioral disturbance: Secondary | ICD-10-CM | POA: Diagnosis not present

## 2017-07-07 DIAGNOSIS — R262 Difficulty in walking, not elsewhere classified: Secondary | ICD-10-CM | POA: Diagnosis not present

## 2017-07-08 DIAGNOSIS — R262 Difficulty in walking, not elsewhere classified: Secondary | ICD-10-CM | POA: Diagnosis not present

## 2017-07-08 DIAGNOSIS — G894 Chronic pain syndrome: Secondary | ICD-10-CM | POA: Diagnosis not present

## 2017-07-08 DIAGNOSIS — F0390 Unspecified dementia without behavioral disturbance: Secondary | ICD-10-CM | POA: Diagnosis not present

## 2017-07-08 DIAGNOSIS — M797 Fibromyalgia: Secondary | ICD-10-CM | POA: Diagnosis not present

## 2017-07-08 DIAGNOSIS — M069 Rheumatoid arthritis, unspecified: Secondary | ICD-10-CM | POA: Diagnosis not present

## 2017-07-10 DIAGNOSIS — F0390 Unspecified dementia without behavioral disturbance: Secondary | ICD-10-CM | POA: Diagnosis not present

## 2017-07-10 DIAGNOSIS — R262 Difficulty in walking, not elsewhere classified: Secondary | ICD-10-CM | POA: Diagnosis not present

## 2017-07-11 DIAGNOSIS — R262 Difficulty in walking, not elsewhere classified: Secondary | ICD-10-CM | POA: Diagnosis not present

## 2017-07-11 DIAGNOSIS — F0390 Unspecified dementia without behavioral disturbance: Secondary | ICD-10-CM | POA: Diagnosis not present

## 2017-07-12 DIAGNOSIS — F0390 Unspecified dementia without behavioral disturbance: Secondary | ICD-10-CM | POA: Diagnosis not present

## 2017-07-12 DIAGNOSIS — R262 Difficulty in walking, not elsewhere classified: Secondary | ICD-10-CM | POA: Diagnosis not present

## 2017-07-13 DIAGNOSIS — F0390 Unspecified dementia without behavioral disturbance: Secondary | ICD-10-CM | POA: Diagnosis not present

## 2017-07-13 DIAGNOSIS — R262 Difficulty in walking, not elsewhere classified: Secondary | ICD-10-CM | POA: Diagnosis not present

## 2017-07-15 DIAGNOSIS — R262 Difficulty in walking, not elsewhere classified: Secondary | ICD-10-CM | POA: Diagnosis not present

## 2017-07-15 DIAGNOSIS — F0390 Unspecified dementia without behavioral disturbance: Secondary | ICD-10-CM | POA: Diagnosis not present

## 2017-07-18 DIAGNOSIS — F0390 Unspecified dementia without behavioral disturbance: Secondary | ICD-10-CM | POA: Diagnosis not present

## 2017-07-18 DIAGNOSIS — R262 Difficulty in walking, not elsewhere classified: Secondary | ICD-10-CM | POA: Diagnosis not present

## 2017-07-19 DIAGNOSIS — F0390 Unspecified dementia without behavioral disturbance: Secondary | ICD-10-CM | POA: Diagnosis not present

## 2017-07-19 DIAGNOSIS — R262 Difficulty in walking, not elsewhere classified: Secondary | ICD-10-CM | POA: Diagnosis not present

## 2017-07-20 DIAGNOSIS — M2550 Pain in unspecified joint: Secondary | ICD-10-CM | POA: Diagnosis not present

## 2017-07-20 DIAGNOSIS — M150 Primary generalized (osteo)arthritis: Secondary | ICD-10-CM | POA: Diagnosis not present

## 2017-07-20 DIAGNOSIS — M154 Erosive (osteo)arthritis: Secondary | ICD-10-CM | POA: Diagnosis not present

## 2017-07-20 DIAGNOSIS — F0390 Unspecified dementia without behavioral disturbance: Secondary | ICD-10-CM | POA: Diagnosis not present

## 2017-07-20 DIAGNOSIS — R262 Difficulty in walking, not elsewhere classified: Secondary | ICD-10-CM | POA: Diagnosis not present

## 2017-07-20 DIAGNOSIS — M0579 Rheumatoid arthritis with rheumatoid factor of multiple sites without organ or systems involvement: Secondary | ICD-10-CM | POA: Diagnosis not present

## 2017-07-20 DIAGNOSIS — Z79899 Other long term (current) drug therapy: Secondary | ICD-10-CM | POA: Diagnosis not present

## 2017-07-21 DIAGNOSIS — R262 Difficulty in walking, not elsewhere classified: Secondary | ICD-10-CM | POA: Diagnosis not present

## 2017-07-21 DIAGNOSIS — F0390 Unspecified dementia without behavioral disturbance: Secondary | ICD-10-CM | POA: Diagnosis not present

## 2017-07-22 DIAGNOSIS — F0390 Unspecified dementia without behavioral disturbance: Secondary | ICD-10-CM | POA: Diagnosis not present

## 2017-07-22 DIAGNOSIS — R262 Difficulty in walking, not elsewhere classified: Secondary | ICD-10-CM | POA: Diagnosis not present

## 2017-07-25 DIAGNOSIS — F0390 Unspecified dementia without behavioral disturbance: Secondary | ICD-10-CM | POA: Diagnosis not present

## 2017-07-25 DIAGNOSIS — R262 Difficulty in walking, not elsewhere classified: Secondary | ICD-10-CM | POA: Diagnosis not present

## 2017-07-26 DIAGNOSIS — F0390 Unspecified dementia without behavioral disturbance: Secondary | ICD-10-CM | POA: Diagnosis not present

## 2017-07-26 DIAGNOSIS — M6281 Muscle weakness (generalized): Secondary | ICD-10-CM | POA: Diagnosis not present

## 2017-07-26 DIAGNOSIS — R262 Difficulty in walking, not elsewhere classified: Secondary | ICD-10-CM | POA: Diagnosis not present

## 2017-07-26 DIAGNOSIS — M059 Rheumatoid arthritis with rheumatoid factor, unspecified: Secondary | ICD-10-CM | POA: Diagnosis not present

## 2017-07-26 DIAGNOSIS — M353 Polymyalgia rheumatica: Secondary | ICD-10-CM | POA: Diagnosis not present

## 2017-07-27 DIAGNOSIS — R262 Difficulty in walking, not elsewhere classified: Secondary | ICD-10-CM | POA: Diagnosis not present

## 2017-07-27 DIAGNOSIS — F0390 Unspecified dementia without behavioral disturbance: Secondary | ICD-10-CM | POA: Diagnosis not present

## 2017-07-27 DIAGNOSIS — R300 Dysuria: Secondary | ICD-10-CM | POA: Diagnosis not present

## 2017-07-28 DIAGNOSIS — N390 Urinary tract infection, site not specified: Secondary | ICD-10-CM | POA: Diagnosis not present

## 2017-07-28 DIAGNOSIS — Z79899 Other long term (current) drug therapy: Secondary | ICD-10-CM | POA: Diagnosis not present

## 2017-07-28 DIAGNOSIS — F0390 Unspecified dementia without behavioral disturbance: Secondary | ICD-10-CM | POA: Diagnosis not present

## 2017-07-28 DIAGNOSIS — R262 Difficulty in walking, not elsewhere classified: Secondary | ICD-10-CM | POA: Diagnosis not present

## 2017-07-28 DIAGNOSIS — R319 Hematuria, unspecified: Secondary | ICD-10-CM | POA: Diagnosis not present

## 2017-07-29 DIAGNOSIS — R262 Difficulty in walking, not elsewhere classified: Secondary | ICD-10-CM | POA: Diagnosis not present

## 2017-07-29 DIAGNOSIS — F0390 Unspecified dementia without behavioral disturbance: Secondary | ICD-10-CM | POA: Diagnosis not present

## 2017-07-30 DIAGNOSIS — R262 Difficulty in walking, not elsewhere classified: Secondary | ICD-10-CM | POA: Diagnosis not present

## 2017-07-30 DIAGNOSIS — F0390 Unspecified dementia without behavioral disturbance: Secondary | ICD-10-CM | POA: Diagnosis not present

## 2017-08-02 DIAGNOSIS — R262 Difficulty in walking, not elsewhere classified: Secondary | ICD-10-CM | POA: Diagnosis not present

## 2017-08-02 DIAGNOSIS — F0390 Unspecified dementia without behavioral disturbance: Secondary | ICD-10-CM | POA: Diagnosis not present

## 2017-08-03 DIAGNOSIS — R262 Difficulty in walking, not elsewhere classified: Secondary | ICD-10-CM | POA: Diagnosis not present

## 2017-08-03 DIAGNOSIS — F0390 Unspecified dementia without behavioral disturbance: Secondary | ICD-10-CM | POA: Diagnosis not present

## 2017-08-04 DIAGNOSIS — R262 Difficulty in walking, not elsewhere classified: Secondary | ICD-10-CM | POA: Diagnosis not present

## 2017-08-04 DIAGNOSIS — F0390 Unspecified dementia without behavioral disturbance: Secondary | ICD-10-CM | POA: Diagnosis not present

## 2017-08-05 DIAGNOSIS — F0390 Unspecified dementia without behavioral disturbance: Secondary | ICD-10-CM | POA: Diagnosis not present

## 2017-08-05 DIAGNOSIS — R262 Difficulty in walking, not elsewhere classified: Secondary | ICD-10-CM | POA: Diagnosis not present

## 2017-08-08 DIAGNOSIS — M797 Fibromyalgia: Secondary | ICD-10-CM | POA: Diagnosis not present

## 2017-08-08 DIAGNOSIS — R262 Difficulty in walking, not elsewhere classified: Secondary | ICD-10-CM | POA: Diagnosis not present

## 2017-08-08 DIAGNOSIS — G894 Chronic pain syndrome: Secondary | ICD-10-CM | POA: Diagnosis not present

## 2017-08-08 DIAGNOSIS — F0390 Unspecified dementia without behavioral disturbance: Secondary | ICD-10-CM | POA: Diagnosis not present

## 2017-08-09 DIAGNOSIS — R262 Difficulty in walking, not elsewhere classified: Secondary | ICD-10-CM | POA: Diagnosis not present

## 2017-08-09 DIAGNOSIS — F0390 Unspecified dementia without behavioral disturbance: Secondary | ICD-10-CM | POA: Diagnosis not present

## 2017-08-10 DIAGNOSIS — F0390 Unspecified dementia without behavioral disturbance: Secondary | ICD-10-CM | POA: Diagnosis not present

## 2017-08-10 DIAGNOSIS — R262 Difficulty in walking, not elsewhere classified: Secondary | ICD-10-CM | POA: Diagnosis not present

## 2017-08-12 DIAGNOSIS — F0390 Unspecified dementia without behavioral disturbance: Secondary | ICD-10-CM | POA: Diagnosis not present

## 2017-08-12 DIAGNOSIS — R262 Difficulty in walking, not elsewhere classified: Secondary | ICD-10-CM | POA: Diagnosis not present

## 2017-08-22 DIAGNOSIS — R05 Cough: Secondary | ICD-10-CM | POA: Diagnosis not present

## 2017-08-22 DIAGNOSIS — I10 Essential (primary) hypertension: Secondary | ICD-10-CM | POA: Diagnosis not present

## 2017-08-22 DIAGNOSIS — E569 Vitamin deficiency, unspecified: Secondary | ICD-10-CM | POA: Diagnosis not present

## 2017-08-22 DIAGNOSIS — K5900 Constipation, unspecified: Secondary | ICD-10-CM | POA: Diagnosis not present

## 2017-08-22 DIAGNOSIS — M069 Rheumatoid arthritis, unspecified: Secondary | ICD-10-CM | POA: Diagnosis not present

## 2017-08-22 DIAGNOSIS — H04129 Dry eye syndrome of unspecified lacrimal gland: Secondary | ICD-10-CM | POA: Diagnosis not present

## 2017-08-22 DIAGNOSIS — B379 Candidiasis, unspecified: Secondary | ICD-10-CM | POA: Diagnosis not present

## 2017-08-22 DIAGNOSIS — M6281 Muscle weakness (generalized): Secondary | ICD-10-CM | POA: Diagnosis not present

## 2017-08-22 DIAGNOSIS — R197 Diarrhea, unspecified: Secondary | ICD-10-CM | POA: Diagnosis not present

## 2017-08-22 DIAGNOSIS — E876 Hypokalemia: Secondary | ICD-10-CM | POA: Diagnosis not present

## 2017-08-22 DIAGNOSIS — M797 Fibromyalgia: Secondary | ICD-10-CM | POA: Diagnosis not present

## 2017-08-22 DIAGNOSIS — R110 Nausea: Secondary | ICD-10-CM | POA: Diagnosis not present

## 2017-08-26 DIAGNOSIS — M797 Fibromyalgia: Secondary | ICD-10-CM | POA: Diagnosis not present

## 2017-08-26 DIAGNOSIS — R110 Nausea: Secondary | ICD-10-CM | POA: Diagnosis not present

## 2017-08-26 DIAGNOSIS — M6281 Muscle weakness (generalized): Secondary | ICD-10-CM | POA: Diagnosis not present

## 2017-08-26 DIAGNOSIS — R05 Cough: Secondary | ICD-10-CM | POA: Diagnosis not present

## 2017-08-26 DIAGNOSIS — H04129 Dry eye syndrome of unspecified lacrimal gland: Secondary | ICD-10-CM | POA: Diagnosis not present

## 2017-08-26 DIAGNOSIS — I10 Essential (primary) hypertension: Secondary | ICD-10-CM | POA: Diagnosis not present

## 2017-08-26 DIAGNOSIS — M069 Rheumatoid arthritis, unspecified: Secondary | ICD-10-CM | POA: Diagnosis not present

## 2017-08-26 DIAGNOSIS — E876 Hypokalemia: Secondary | ICD-10-CM | POA: Diagnosis not present

## 2017-08-26 DIAGNOSIS — B379 Candidiasis, unspecified: Secondary | ICD-10-CM | POA: Diagnosis not present

## 2017-08-26 DIAGNOSIS — E569 Vitamin deficiency, unspecified: Secondary | ICD-10-CM | POA: Diagnosis not present

## 2017-08-26 DIAGNOSIS — R197 Diarrhea, unspecified: Secondary | ICD-10-CM | POA: Diagnosis not present

## 2017-08-26 DIAGNOSIS — K5900 Constipation, unspecified: Secondary | ICD-10-CM | POA: Diagnosis not present

## 2017-09-08 DIAGNOSIS — M797 Fibromyalgia: Secondary | ICD-10-CM | POA: Diagnosis not present

## 2017-09-08 DIAGNOSIS — G894 Chronic pain syndrome: Secondary | ICD-10-CM | POA: Diagnosis not present

## 2017-09-08 DIAGNOSIS — M059 Rheumatoid arthritis with rheumatoid factor, unspecified: Secondary | ICD-10-CM | POA: Diagnosis not present

## 2017-09-29 DIAGNOSIS — F419 Anxiety disorder, unspecified: Secondary | ICD-10-CM | POA: Diagnosis not present

## 2017-09-29 DIAGNOSIS — B379 Candidiasis, unspecified: Secondary | ICD-10-CM | POA: Diagnosis not present

## 2017-09-29 DIAGNOSIS — N390 Urinary tract infection, site not specified: Secondary | ICD-10-CM | POA: Diagnosis not present

## 2017-09-29 DIAGNOSIS — R110 Nausea: Secondary | ICD-10-CM | POA: Diagnosis not present

## 2017-09-29 DIAGNOSIS — E569 Vitamin deficiency, unspecified: Secondary | ICD-10-CM | POA: Diagnosis not present

## 2017-09-29 DIAGNOSIS — M069 Rheumatoid arthritis, unspecified: Secondary | ICD-10-CM | POA: Diagnosis not present

## 2017-09-29 DIAGNOSIS — R1319 Other dysphagia: Secondary | ICD-10-CM | POA: Diagnosis not present

## 2017-09-29 DIAGNOSIS — I10 Essential (primary) hypertension: Secondary | ICD-10-CM | POA: Diagnosis not present

## 2017-09-29 DIAGNOSIS — F323 Major depressive disorder, single episode, severe with psychotic features: Secondary | ICD-10-CM | POA: Diagnosis not present

## 2017-09-29 DIAGNOSIS — H04129 Dry eye syndrome of unspecified lacrimal gland: Secondary | ICD-10-CM | POA: Diagnosis not present

## 2017-09-29 DIAGNOSIS — K219 Gastro-esophageal reflux disease without esophagitis: Secondary | ICD-10-CM | POA: Diagnosis not present

## 2017-09-29 DIAGNOSIS — M6281 Muscle weakness (generalized): Secondary | ICD-10-CM | POA: Diagnosis not present

## 2017-10-11 DIAGNOSIS — M069 Rheumatoid arthritis, unspecified: Secondary | ICD-10-CM | POA: Diagnosis not present

## 2017-10-11 DIAGNOSIS — M797 Fibromyalgia: Secondary | ICD-10-CM | POA: Diagnosis not present

## 2017-10-20 DIAGNOSIS — I10 Essential (primary) hypertension: Secondary | ICD-10-CM | POA: Diagnosis not present

## 2017-10-20 DIAGNOSIS — D649 Anemia, unspecified: Secondary | ICD-10-CM | POA: Diagnosis not present

## 2017-10-31 DIAGNOSIS — N390 Urinary tract infection, site not specified: Secondary | ICD-10-CM | POA: Diagnosis not present

## 2017-10-31 DIAGNOSIS — H04129 Dry eye syndrome of unspecified lacrimal gland: Secondary | ICD-10-CM | POA: Diagnosis not present

## 2017-10-31 DIAGNOSIS — K5900 Constipation, unspecified: Secondary | ICD-10-CM | POA: Diagnosis not present

## 2017-10-31 DIAGNOSIS — M6281 Muscle weakness (generalized): Secondary | ICD-10-CM | POA: Diagnosis not present

## 2017-10-31 DIAGNOSIS — F419 Anxiety disorder, unspecified: Secondary | ICD-10-CM | POA: Diagnosis not present

## 2017-10-31 DIAGNOSIS — B379 Candidiasis, unspecified: Secondary | ICD-10-CM | POA: Diagnosis not present

## 2017-10-31 DIAGNOSIS — K219 Gastro-esophageal reflux disease without esophagitis: Secondary | ICD-10-CM | POA: Diagnosis not present

## 2017-10-31 DIAGNOSIS — E569 Vitamin deficiency, unspecified: Secondary | ICD-10-CM | POA: Diagnosis not present

## 2017-10-31 DIAGNOSIS — F323 Major depressive disorder, single episode, severe with psychotic features: Secondary | ICD-10-CM | POA: Diagnosis not present

## 2017-10-31 DIAGNOSIS — R110 Nausea: Secondary | ICD-10-CM | POA: Diagnosis not present

## 2017-10-31 DIAGNOSIS — M069 Rheumatoid arthritis, unspecified: Secondary | ICD-10-CM | POA: Diagnosis not present

## 2017-10-31 DIAGNOSIS — I10 Essential (primary) hypertension: Secondary | ICD-10-CM | POA: Diagnosis not present

## 2017-11-08 DIAGNOSIS — M797 Fibromyalgia: Secondary | ICD-10-CM | POA: Diagnosis not present

## 2017-11-08 DIAGNOSIS — G894 Chronic pain syndrome: Secondary | ICD-10-CM | POA: Diagnosis not present

## 2017-11-08 DIAGNOSIS — M069 Rheumatoid arthritis, unspecified: Secondary | ICD-10-CM | POA: Diagnosis not present

## 2017-11-14 DIAGNOSIS — F419 Anxiety disorder, unspecified: Secondary | ICD-10-CM | POA: Diagnosis not present

## 2017-11-14 DIAGNOSIS — H04129 Dry eye syndrome of unspecified lacrimal gland: Secondary | ICD-10-CM | POA: Diagnosis not present

## 2017-11-14 DIAGNOSIS — R110 Nausea: Secondary | ICD-10-CM | POA: Diagnosis not present

## 2017-11-14 DIAGNOSIS — M069 Rheumatoid arthritis, unspecified: Secondary | ICD-10-CM | POA: Diagnosis not present

## 2017-11-14 DIAGNOSIS — M6281 Muscle weakness (generalized): Secondary | ICD-10-CM | POA: Diagnosis not present

## 2017-11-14 DIAGNOSIS — F323 Major depressive disorder, single episode, severe with psychotic features: Secondary | ICD-10-CM | POA: Diagnosis not present

## 2017-11-14 DIAGNOSIS — N390 Urinary tract infection, site not specified: Secondary | ICD-10-CM | POA: Diagnosis not present

## 2017-11-14 DIAGNOSIS — B379 Candidiasis, unspecified: Secondary | ICD-10-CM | POA: Diagnosis not present

## 2017-11-14 DIAGNOSIS — K5900 Constipation, unspecified: Secondary | ICD-10-CM | POA: Diagnosis not present

## 2017-11-14 DIAGNOSIS — E569 Vitamin deficiency, unspecified: Secondary | ICD-10-CM | POA: Diagnosis not present

## 2017-11-14 DIAGNOSIS — K219 Gastro-esophageal reflux disease without esophagitis: Secondary | ICD-10-CM | POA: Diagnosis not present

## 2017-11-14 DIAGNOSIS — I10 Essential (primary) hypertension: Secondary | ICD-10-CM | POA: Diagnosis not present

## 2017-11-16 DIAGNOSIS — M059 Rheumatoid arthritis with rheumatoid factor, unspecified: Secondary | ICD-10-CM | POA: Diagnosis not present

## 2017-11-16 DIAGNOSIS — R2681 Unsteadiness on feet: Secondary | ICD-10-CM | POA: Diagnosis not present

## 2017-11-16 DIAGNOSIS — M069 Rheumatoid arthritis, unspecified: Secondary | ICD-10-CM | POA: Diagnosis not present

## 2017-11-16 DIAGNOSIS — R293 Abnormal posture: Secondary | ICD-10-CM | POA: Diagnosis not present

## 2017-11-22 DIAGNOSIS — M6281 Muscle weakness (generalized): Secondary | ICD-10-CM | POA: Diagnosis not present

## 2017-11-22 DIAGNOSIS — M069 Rheumatoid arthritis, unspecified: Secondary | ICD-10-CM | POA: Diagnosis not present

## 2017-11-22 DIAGNOSIS — M059 Rheumatoid arthritis with rheumatoid factor, unspecified: Secondary | ICD-10-CM | POA: Diagnosis not present

## 2017-11-22 DIAGNOSIS — R293 Abnormal posture: Secondary | ICD-10-CM | POA: Diagnosis not present

## 2017-11-22 DIAGNOSIS — M797 Fibromyalgia: Secondary | ICD-10-CM | POA: Diagnosis not present

## 2017-11-22 DIAGNOSIS — R2681 Unsteadiness on feet: Secondary | ICD-10-CM | POA: Diagnosis not present

## 2017-11-24 DIAGNOSIS — M059 Rheumatoid arthritis with rheumatoid factor, unspecified: Secondary | ICD-10-CM | POA: Diagnosis not present

## 2017-11-24 DIAGNOSIS — R293 Abnormal posture: Secondary | ICD-10-CM | POA: Diagnosis not present

## 2017-11-24 DIAGNOSIS — M069 Rheumatoid arthritis, unspecified: Secondary | ICD-10-CM | POA: Diagnosis not present

## 2017-11-24 DIAGNOSIS — R2681 Unsteadiness on feet: Secondary | ICD-10-CM | POA: Diagnosis not present

## 2017-11-29 DIAGNOSIS — R2681 Unsteadiness on feet: Secondary | ICD-10-CM | POA: Diagnosis not present

## 2017-11-29 DIAGNOSIS — M069 Rheumatoid arthritis, unspecified: Secondary | ICD-10-CM | POA: Diagnosis not present

## 2017-11-29 DIAGNOSIS — R293 Abnormal posture: Secondary | ICD-10-CM | POA: Diagnosis not present

## 2017-11-29 DIAGNOSIS — M059 Rheumatoid arthritis with rheumatoid factor, unspecified: Secondary | ICD-10-CM | POA: Diagnosis not present

## 2017-12-01 DIAGNOSIS — M6281 Muscle weakness (generalized): Secondary | ICD-10-CM | POA: Diagnosis not present

## 2017-12-01 DIAGNOSIS — F323 Major depressive disorder, single episode, severe with psychotic features: Secondary | ICD-10-CM | POA: Diagnosis not present

## 2017-12-01 DIAGNOSIS — R110 Nausea: Secondary | ICD-10-CM | POA: Diagnosis not present

## 2017-12-01 DIAGNOSIS — N390 Urinary tract infection, site not specified: Secondary | ICD-10-CM | POA: Diagnosis not present

## 2017-12-01 DIAGNOSIS — K219 Gastro-esophageal reflux disease without esophagitis: Secondary | ICD-10-CM | POA: Diagnosis not present

## 2017-12-01 DIAGNOSIS — H04129 Dry eye syndrome of unspecified lacrimal gland: Secondary | ICD-10-CM | POA: Diagnosis not present

## 2017-12-01 DIAGNOSIS — B379 Candidiasis, unspecified: Secondary | ICD-10-CM | POA: Diagnosis not present

## 2017-12-01 DIAGNOSIS — F419 Anxiety disorder, unspecified: Secondary | ICD-10-CM | POA: Diagnosis not present

## 2017-12-01 DIAGNOSIS — E569 Vitamin deficiency, unspecified: Secondary | ICD-10-CM | POA: Diagnosis not present

## 2017-12-01 DIAGNOSIS — I10 Essential (primary) hypertension: Secondary | ICD-10-CM | POA: Diagnosis not present

## 2017-12-01 DIAGNOSIS — K5900 Constipation, unspecified: Secondary | ICD-10-CM | POA: Diagnosis not present

## 2017-12-01 DIAGNOSIS — M069 Rheumatoid arthritis, unspecified: Secondary | ICD-10-CM | POA: Diagnosis not present

## 2017-12-09 DIAGNOSIS — G894 Chronic pain syndrome: Secondary | ICD-10-CM | POA: Diagnosis not present

## 2017-12-09 DIAGNOSIS — M797 Fibromyalgia: Secondary | ICD-10-CM | POA: Diagnosis not present

## 2017-12-09 DIAGNOSIS — M059 Rheumatoid arthritis with rheumatoid factor, unspecified: Secondary | ICD-10-CM | POA: Diagnosis not present

## 2018-01-04 DIAGNOSIS — B379 Candidiasis, unspecified: Secondary | ICD-10-CM | POA: Diagnosis not present

## 2018-01-04 DIAGNOSIS — K219 Gastro-esophageal reflux disease without esophagitis: Secondary | ICD-10-CM | POA: Diagnosis not present

## 2018-01-04 DIAGNOSIS — H04129 Dry eye syndrome of unspecified lacrimal gland: Secondary | ICD-10-CM | POA: Diagnosis not present

## 2018-01-04 DIAGNOSIS — R110 Nausea: Secondary | ICD-10-CM | POA: Diagnosis not present

## 2018-01-04 DIAGNOSIS — F323 Major depressive disorder, single episode, severe with psychotic features: Secondary | ICD-10-CM | POA: Diagnosis not present

## 2018-01-04 DIAGNOSIS — E569 Vitamin deficiency, unspecified: Secondary | ICD-10-CM | POA: Diagnosis not present

## 2018-01-04 DIAGNOSIS — F419 Anxiety disorder, unspecified: Secondary | ICD-10-CM | POA: Diagnosis not present

## 2018-01-04 DIAGNOSIS — M069 Rheumatoid arthritis, unspecified: Secondary | ICD-10-CM | POA: Diagnosis not present

## 2018-01-04 DIAGNOSIS — M6281 Muscle weakness (generalized): Secondary | ICD-10-CM | POA: Diagnosis not present

## 2018-01-04 DIAGNOSIS — K5900 Constipation, unspecified: Secondary | ICD-10-CM | POA: Diagnosis not present

## 2018-01-04 DIAGNOSIS — I10 Essential (primary) hypertension: Secondary | ICD-10-CM | POA: Diagnosis not present

## 2018-01-04 DIAGNOSIS — R1310 Dysphagia, unspecified: Secondary | ICD-10-CM | POA: Diagnosis not present

## 2018-01-10 DIAGNOSIS — M797 Fibromyalgia: Secondary | ICD-10-CM | POA: Diagnosis not present

## 2018-01-10 DIAGNOSIS — M059 Rheumatoid arthritis with rheumatoid factor, unspecified: Secondary | ICD-10-CM | POA: Diagnosis not present

## 2018-01-10 DIAGNOSIS — G894 Chronic pain syndrome: Secondary | ICD-10-CM | POA: Diagnosis not present

## 2018-01-17 DIAGNOSIS — Z79899 Other long term (current) drug therapy: Secondary | ICD-10-CM | POA: Diagnosis not present

## 2018-01-17 DIAGNOSIS — M0579 Rheumatoid arthritis with rheumatoid factor of multiple sites without organ or systems involvement: Secondary | ICD-10-CM | POA: Diagnosis not present

## 2018-01-17 DIAGNOSIS — M154 Erosive (osteo)arthritis: Secondary | ICD-10-CM | POA: Diagnosis not present

## 2018-01-17 DIAGNOSIS — M150 Primary generalized (osteo)arthritis: Secondary | ICD-10-CM | POA: Diagnosis not present

## 2018-01-17 DIAGNOSIS — M2550 Pain in unspecified joint: Secondary | ICD-10-CM | POA: Diagnosis not present

## 2018-01-19 DIAGNOSIS — L299 Pruritus, unspecified: Secondary | ICD-10-CM | POA: Diagnosis not present

## 2018-01-19 DIAGNOSIS — R21 Rash and other nonspecific skin eruption: Secondary | ICD-10-CM | POA: Diagnosis not present

## 2018-01-24 DIAGNOSIS — I10 Essential (primary) hypertension: Secondary | ICD-10-CM | POA: Diagnosis not present

## 2018-01-24 DIAGNOSIS — R1310 Dysphagia, unspecified: Secondary | ICD-10-CM | POA: Diagnosis not present

## 2018-01-24 DIAGNOSIS — R197 Diarrhea, unspecified: Secondary | ICD-10-CM | POA: Diagnosis not present

## 2018-01-24 DIAGNOSIS — M069 Rheumatoid arthritis, unspecified: Secondary | ICD-10-CM | POA: Diagnosis not present

## 2018-01-24 DIAGNOSIS — M6281 Muscle weakness (generalized): Secondary | ICD-10-CM | POA: Diagnosis not present

## 2018-01-24 DIAGNOSIS — B379 Candidiasis, unspecified: Secondary | ICD-10-CM | POA: Diagnosis not present

## 2018-01-24 DIAGNOSIS — K219 Gastro-esophageal reflux disease without esophagitis: Secondary | ICD-10-CM | POA: Diagnosis not present

## 2018-01-24 DIAGNOSIS — K5900 Constipation, unspecified: Secondary | ICD-10-CM | POA: Diagnosis not present

## 2018-01-24 DIAGNOSIS — E569 Vitamin deficiency, unspecified: Secondary | ICD-10-CM | POA: Diagnosis not present

## 2018-01-24 DIAGNOSIS — F419 Anxiety disorder, unspecified: Secondary | ICD-10-CM | POA: Diagnosis not present

## 2018-01-24 DIAGNOSIS — F323 Major depressive disorder, single episode, severe with psychotic features: Secondary | ICD-10-CM | POA: Diagnosis not present

## 2018-01-24 DIAGNOSIS — H04129 Dry eye syndrome of unspecified lacrimal gland: Secondary | ICD-10-CM | POA: Diagnosis not present

## 2018-01-25 DIAGNOSIS — K5730 Diverticulosis of large intestine without perforation or abscess without bleeding: Secondary | ICD-10-CM | POA: Diagnosis not present

## 2018-01-25 DIAGNOSIS — R197 Diarrhea, unspecified: Secondary | ICD-10-CM | POA: Diagnosis not present

## 2018-01-25 DIAGNOSIS — D649 Anemia, unspecified: Secondary | ICD-10-CM | POA: Diagnosis not present

## 2018-02-06 DIAGNOSIS — F323 Major depressive disorder, single episode, severe with psychotic features: Secondary | ICD-10-CM | POA: Diagnosis not present

## 2018-02-06 DIAGNOSIS — M069 Rheumatoid arthritis, unspecified: Secondary | ICD-10-CM | POA: Diagnosis not present

## 2018-02-06 DIAGNOSIS — R109 Unspecified abdominal pain: Secondary | ICD-10-CM | POA: Diagnosis not present

## 2018-02-06 DIAGNOSIS — F419 Anxiety disorder, unspecified: Secondary | ICD-10-CM | POA: Diagnosis not present

## 2018-02-06 DIAGNOSIS — K219 Gastro-esophageal reflux disease without esophagitis: Secondary | ICD-10-CM | POA: Diagnosis not present

## 2018-02-06 DIAGNOSIS — H04129 Dry eye syndrome of unspecified lacrimal gland: Secondary | ICD-10-CM | POA: Diagnosis not present

## 2018-02-06 DIAGNOSIS — K5900 Constipation, unspecified: Secondary | ICD-10-CM | POA: Diagnosis not present

## 2018-02-06 DIAGNOSIS — M797 Fibromyalgia: Secondary | ICD-10-CM | POA: Diagnosis not present

## 2018-02-06 DIAGNOSIS — E569 Vitamin deficiency, unspecified: Secondary | ICD-10-CM | POA: Diagnosis not present

## 2018-02-06 DIAGNOSIS — R21 Rash and other nonspecific skin eruption: Secondary | ICD-10-CM | POA: Diagnosis not present

## 2018-02-06 DIAGNOSIS — R197 Diarrhea, unspecified: Secondary | ICD-10-CM | POA: Diagnosis not present

## 2018-02-06 DIAGNOSIS — M6281 Muscle weakness (generalized): Secondary | ICD-10-CM | POA: Diagnosis not present

## 2018-02-07 DIAGNOSIS — R197 Diarrhea, unspecified: Secondary | ICD-10-CM | POA: Diagnosis not present

## 2018-02-07 DIAGNOSIS — A0472 Enterocolitis due to Clostridium difficile, not specified as recurrent: Secondary | ICD-10-CM | POA: Diagnosis not present

## 2018-02-10 DIAGNOSIS — R197 Diarrhea, unspecified: Secondary | ICD-10-CM | POA: Diagnosis not present

## 2018-02-10 DIAGNOSIS — L989 Disorder of the skin and subcutaneous tissue, unspecified: Secondary | ICD-10-CM | POA: Diagnosis not present

## 2018-02-13 DIAGNOSIS — K5900 Constipation, unspecified: Secondary | ICD-10-CM | POA: Diagnosis not present

## 2018-02-13 DIAGNOSIS — M059 Rheumatoid arthritis with rheumatoid factor, unspecified: Secondary | ICD-10-CM | POA: Diagnosis not present

## 2018-02-13 DIAGNOSIS — M797 Fibromyalgia: Secondary | ICD-10-CM | POA: Diagnosis not present

## 2018-03-06 DIAGNOSIS — M6281 Muscle weakness (generalized): Secondary | ICD-10-CM | POA: Diagnosis not present

## 2018-03-06 DIAGNOSIS — R2689 Other abnormalities of gait and mobility: Secondary | ICD-10-CM | POA: Diagnosis not present

## 2018-03-06 DIAGNOSIS — I10 Essential (primary) hypertension: Secondary | ICD-10-CM | POA: Diagnosis not present

## 2018-03-06 DIAGNOSIS — M797 Fibromyalgia: Secondary | ICD-10-CM | POA: Diagnosis not present

## 2018-03-07 DIAGNOSIS — M6281 Muscle weakness (generalized): Secondary | ICD-10-CM | POA: Diagnosis not present

## 2018-03-07 DIAGNOSIS — M797 Fibromyalgia: Secondary | ICD-10-CM | POA: Diagnosis not present

## 2018-03-07 DIAGNOSIS — R2689 Other abnormalities of gait and mobility: Secondary | ICD-10-CM | POA: Diagnosis not present

## 2018-03-07 DIAGNOSIS — I10 Essential (primary) hypertension: Secondary | ICD-10-CM | POA: Diagnosis not present

## 2018-03-08 DIAGNOSIS — H04129 Dry eye syndrome of unspecified lacrimal gland: Secondary | ICD-10-CM | POA: Diagnosis not present

## 2018-03-08 DIAGNOSIS — R197 Diarrhea, unspecified: Secondary | ICD-10-CM | POA: Diagnosis not present

## 2018-03-08 DIAGNOSIS — M069 Rheumatoid arthritis, unspecified: Secondary | ICD-10-CM | POA: Diagnosis not present

## 2018-03-08 DIAGNOSIS — I10 Essential (primary) hypertension: Secondary | ICD-10-CM | POA: Diagnosis not present

## 2018-03-08 DIAGNOSIS — R2689 Other abnormalities of gait and mobility: Secondary | ICD-10-CM | POA: Diagnosis not present

## 2018-03-08 DIAGNOSIS — R109 Unspecified abdominal pain: Secondary | ICD-10-CM | POA: Diagnosis not present

## 2018-03-08 DIAGNOSIS — M6281 Muscle weakness (generalized): Secondary | ICD-10-CM | POA: Diagnosis not present

## 2018-03-08 DIAGNOSIS — E569 Vitamin deficiency, unspecified: Secondary | ICD-10-CM | POA: Diagnosis not present

## 2018-03-08 DIAGNOSIS — F419 Anxiety disorder, unspecified: Secondary | ICD-10-CM | POA: Diagnosis not present

## 2018-03-08 DIAGNOSIS — M797 Fibromyalgia: Secondary | ICD-10-CM | POA: Diagnosis not present

## 2018-03-08 DIAGNOSIS — K5900 Constipation, unspecified: Secondary | ICD-10-CM | POA: Diagnosis not present

## 2018-03-08 DIAGNOSIS — R21 Rash and other nonspecific skin eruption: Secondary | ICD-10-CM | POA: Diagnosis not present

## 2018-03-08 DIAGNOSIS — F323 Major depressive disorder, single episode, severe with psychotic features: Secondary | ICD-10-CM | POA: Diagnosis not present

## 2018-03-08 DIAGNOSIS — K219 Gastro-esophageal reflux disease without esophagitis: Secondary | ICD-10-CM | POA: Diagnosis not present

## 2018-03-09 DIAGNOSIS — M6281 Muscle weakness (generalized): Secondary | ICD-10-CM | POA: Diagnosis not present

## 2018-03-09 DIAGNOSIS — M797 Fibromyalgia: Secondary | ICD-10-CM | POA: Diagnosis not present

## 2018-03-09 DIAGNOSIS — I10 Essential (primary) hypertension: Secondary | ICD-10-CM | POA: Diagnosis not present

## 2018-03-09 DIAGNOSIS — R2689 Other abnormalities of gait and mobility: Secondary | ICD-10-CM | POA: Diagnosis not present

## 2018-03-12 DIAGNOSIS — M797 Fibromyalgia: Secondary | ICD-10-CM | POA: Diagnosis not present

## 2018-03-12 DIAGNOSIS — R2689 Other abnormalities of gait and mobility: Secondary | ICD-10-CM | POA: Diagnosis not present

## 2018-03-12 DIAGNOSIS — I10 Essential (primary) hypertension: Secondary | ICD-10-CM | POA: Diagnosis not present

## 2018-03-12 DIAGNOSIS — M6281 Muscle weakness (generalized): Secondary | ICD-10-CM | POA: Diagnosis not present

## 2018-03-14 DIAGNOSIS — R2689 Other abnormalities of gait and mobility: Secondary | ICD-10-CM | POA: Diagnosis not present

## 2018-03-14 DIAGNOSIS — I10 Essential (primary) hypertension: Secondary | ICD-10-CM | POA: Diagnosis not present

## 2018-03-14 DIAGNOSIS — M797 Fibromyalgia: Secondary | ICD-10-CM | POA: Diagnosis not present

## 2018-03-14 DIAGNOSIS — M6281 Muscle weakness (generalized): Secondary | ICD-10-CM | POA: Diagnosis not present

## 2018-03-15 DIAGNOSIS — I10 Essential (primary) hypertension: Secondary | ICD-10-CM | POA: Diagnosis not present

## 2018-03-15 DIAGNOSIS — M059 Rheumatoid arthritis with rheumatoid factor, unspecified: Secondary | ICD-10-CM | POA: Diagnosis not present

## 2018-03-15 DIAGNOSIS — M6281 Muscle weakness (generalized): Secondary | ICD-10-CM | POA: Diagnosis not present

## 2018-03-15 DIAGNOSIS — G894 Chronic pain syndrome: Secondary | ICD-10-CM | POA: Diagnosis not present

## 2018-03-15 DIAGNOSIS — M797 Fibromyalgia: Secondary | ICD-10-CM | POA: Diagnosis not present

## 2018-03-15 DIAGNOSIS — R2689 Other abnormalities of gait and mobility: Secondary | ICD-10-CM | POA: Diagnosis not present

## 2018-03-16 DIAGNOSIS — M797 Fibromyalgia: Secondary | ICD-10-CM | POA: Diagnosis not present

## 2018-03-16 DIAGNOSIS — R2689 Other abnormalities of gait and mobility: Secondary | ICD-10-CM | POA: Diagnosis not present

## 2018-03-16 DIAGNOSIS — I10 Essential (primary) hypertension: Secondary | ICD-10-CM | POA: Diagnosis not present

## 2018-03-16 DIAGNOSIS — M6281 Muscle weakness (generalized): Secondary | ICD-10-CM | POA: Diagnosis not present

## 2018-03-17 DIAGNOSIS — M797 Fibromyalgia: Secondary | ICD-10-CM | POA: Diagnosis not present

## 2018-03-17 DIAGNOSIS — R21 Rash and other nonspecific skin eruption: Secondary | ICD-10-CM | POA: Diagnosis not present

## 2018-03-17 DIAGNOSIS — R2689 Other abnormalities of gait and mobility: Secondary | ICD-10-CM | POA: Diagnosis not present

## 2018-03-17 DIAGNOSIS — I10 Essential (primary) hypertension: Secondary | ICD-10-CM | POA: Diagnosis not present

## 2018-03-17 DIAGNOSIS — M6281 Muscle weakness (generalized): Secondary | ICD-10-CM | POA: Diagnosis not present

## 2018-03-19 DIAGNOSIS — I10 Essential (primary) hypertension: Secondary | ICD-10-CM | POA: Diagnosis not present

## 2018-03-19 DIAGNOSIS — M6281 Muscle weakness (generalized): Secondary | ICD-10-CM | POA: Diagnosis not present

## 2018-03-19 DIAGNOSIS — M797 Fibromyalgia: Secondary | ICD-10-CM | POA: Diagnosis not present

## 2018-03-19 DIAGNOSIS — R2689 Other abnormalities of gait and mobility: Secondary | ICD-10-CM | POA: Diagnosis not present

## 2018-03-20 DIAGNOSIS — I10 Essential (primary) hypertension: Secondary | ICD-10-CM | POA: Diagnosis not present

## 2018-03-20 DIAGNOSIS — M6281 Muscle weakness (generalized): Secondary | ICD-10-CM | POA: Diagnosis not present

## 2018-03-20 DIAGNOSIS — M797 Fibromyalgia: Secondary | ICD-10-CM | POA: Diagnosis not present

## 2018-03-20 DIAGNOSIS — R2689 Other abnormalities of gait and mobility: Secondary | ICD-10-CM | POA: Diagnosis not present

## 2018-04-04 DIAGNOSIS — H9209 Otalgia, unspecified ear: Secondary | ICD-10-CM | POA: Diagnosis not present

## 2018-04-04 DIAGNOSIS — J029 Acute pharyngitis, unspecified: Secondary | ICD-10-CM | POA: Diagnosis not present

## 2018-04-04 DIAGNOSIS — H6123 Impacted cerumen, bilateral: Secondary | ICD-10-CM | POA: Diagnosis not present

## 2018-04-07 DIAGNOSIS — K5900 Constipation, unspecified: Secondary | ICD-10-CM | POA: Diagnosis not present

## 2018-04-07 DIAGNOSIS — H6123 Impacted cerumen, bilateral: Secondary | ICD-10-CM | POA: Diagnosis not present

## 2018-04-07 DIAGNOSIS — M6281 Muscle weakness (generalized): Secondary | ICD-10-CM | POA: Diagnosis not present

## 2018-04-07 DIAGNOSIS — M069 Rheumatoid arthritis, unspecified: Secondary | ICD-10-CM | POA: Diagnosis not present

## 2018-04-07 DIAGNOSIS — F419 Anxiety disorder, unspecified: Secondary | ICD-10-CM | POA: Diagnosis not present

## 2018-04-07 DIAGNOSIS — K219 Gastro-esophageal reflux disease without esophagitis: Secondary | ICD-10-CM | POA: Diagnosis not present

## 2018-04-07 DIAGNOSIS — R21 Rash and other nonspecific skin eruption: Secondary | ICD-10-CM | POA: Diagnosis not present

## 2018-04-07 DIAGNOSIS — F323 Major depressive disorder, single episode, severe with psychotic features: Secondary | ICD-10-CM | POA: Diagnosis not present

## 2018-04-07 DIAGNOSIS — M797 Fibromyalgia: Secondary | ICD-10-CM | POA: Diagnosis not present

## 2018-04-07 DIAGNOSIS — R197 Diarrhea, unspecified: Secondary | ICD-10-CM | POA: Diagnosis not present

## 2018-04-07 DIAGNOSIS — R109 Unspecified abdominal pain: Secondary | ICD-10-CM | POA: Diagnosis not present

## 2018-04-07 DIAGNOSIS — H04129 Dry eye syndrome of unspecified lacrimal gland: Secondary | ICD-10-CM | POA: Diagnosis not present

## 2018-04-10 DIAGNOSIS — M6281 Muscle weakness (generalized): Secondary | ICD-10-CM | POA: Diagnosis not present

## 2018-04-10 DIAGNOSIS — B379 Candidiasis, unspecified: Secondary | ICD-10-CM | POA: Diagnosis not present

## 2018-04-10 DIAGNOSIS — F323 Major depressive disorder, single episode, severe with psychotic features: Secondary | ICD-10-CM | POA: Diagnosis not present

## 2018-04-10 DIAGNOSIS — H04129 Dry eye syndrome of unspecified lacrimal gland: Secondary | ICD-10-CM | POA: Diagnosis not present

## 2018-04-10 DIAGNOSIS — M797 Fibromyalgia: Secondary | ICD-10-CM | POA: Diagnosis not present

## 2018-04-10 DIAGNOSIS — R110 Nausea: Secondary | ICD-10-CM | POA: Diagnosis not present

## 2018-04-10 DIAGNOSIS — K219 Gastro-esophageal reflux disease without esophagitis: Secondary | ICD-10-CM | POA: Diagnosis not present

## 2018-04-10 DIAGNOSIS — K5900 Constipation, unspecified: Secondary | ICD-10-CM | POA: Diagnosis not present

## 2018-04-10 DIAGNOSIS — M069 Rheumatoid arthritis, unspecified: Secondary | ICD-10-CM | POA: Diagnosis not present

## 2018-04-10 DIAGNOSIS — H6123 Impacted cerumen, bilateral: Secondary | ICD-10-CM | POA: Diagnosis not present

## 2018-04-10 DIAGNOSIS — R197 Diarrhea, unspecified: Secondary | ICD-10-CM | POA: Diagnosis not present

## 2018-04-10 DIAGNOSIS — F419 Anxiety disorder, unspecified: Secondary | ICD-10-CM | POA: Diagnosis not present

## 2018-04-14 DIAGNOSIS — M797 Fibromyalgia: Secondary | ICD-10-CM | POA: Diagnosis not present

## 2018-04-14 DIAGNOSIS — M059 Rheumatoid arthritis with rheumatoid factor, unspecified: Secondary | ICD-10-CM | POA: Diagnosis not present

## 2018-04-14 DIAGNOSIS — G894 Chronic pain syndrome: Secondary | ICD-10-CM | POA: Diagnosis not present

## 2018-04-15 DIAGNOSIS — D649 Anemia, unspecified: Secondary | ICD-10-CM | POA: Diagnosis not present

## 2018-04-15 DIAGNOSIS — I10 Essential (primary) hypertension: Secondary | ICD-10-CM | POA: Diagnosis not present

## 2018-04-15 DIAGNOSIS — I509 Heart failure, unspecified: Secondary | ICD-10-CM | POA: Diagnosis not present

## 2018-04-18 DIAGNOSIS — M0579 Rheumatoid arthritis with rheumatoid factor of multiple sites without organ or systems involvement: Secondary | ICD-10-CM | POA: Diagnosis not present

## 2018-04-18 DIAGNOSIS — M2550 Pain in unspecified joint: Secondary | ICD-10-CM | POA: Diagnosis not present

## 2018-04-18 DIAGNOSIS — M154 Erosive (osteo)arthritis: Secondary | ICD-10-CM | POA: Diagnosis not present

## 2018-04-18 DIAGNOSIS — M150 Primary generalized (osteo)arthritis: Secondary | ICD-10-CM | POA: Diagnosis not present

## 2018-04-18 DIAGNOSIS — Z79899 Other long term (current) drug therapy: Secondary | ICD-10-CM | POA: Diagnosis not present

## 2018-04-28 DIAGNOSIS — R55 Syncope and collapse: Secondary | ICD-10-CM | POA: Diagnosis not present

## 2018-06-27 DIAGNOSIS — R110 Nausea: Secondary | ICD-10-CM | POA: Diagnosis not present

## 2018-06-27 DIAGNOSIS — M6281 Muscle weakness (generalized): Secondary | ICD-10-CM | POA: Diagnosis not present

## 2018-06-27 DIAGNOSIS — F419 Anxiety disorder, unspecified: Secondary | ICD-10-CM | POA: Diagnosis not present

## 2018-06-27 DIAGNOSIS — K5900 Constipation, unspecified: Secondary | ICD-10-CM | POA: Diagnosis not present

## 2018-06-27 DIAGNOSIS — F323 Major depressive disorder, single episode, severe with psychotic features: Secondary | ICD-10-CM | POA: Diagnosis not present

## 2018-06-27 DIAGNOSIS — M069 Rheumatoid arthritis, unspecified: Secondary | ICD-10-CM | POA: Diagnosis not present

## 2018-06-27 DIAGNOSIS — K219 Gastro-esophageal reflux disease without esophagitis: Secondary | ICD-10-CM | POA: Diagnosis not present

## 2018-06-27 DIAGNOSIS — H04129 Dry eye syndrome of unspecified lacrimal gland: Secondary | ICD-10-CM | POA: Diagnosis not present

## 2018-06-27 DIAGNOSIS — M797 Fibromyalgia: Secondary | ICD-10-CM | POA: Diagnosis not present

## 2018-07-10 ENCOUNTER — Ambulatory Visit (INDEPENDENT_AMBULATORY_CARE_PROVIDER_SITE_OTHER): Payer: Medicare Other | Admitting: Cardiology

## 2018-07-10 ENCOUNTER — Encounter: Payer: Self-pay | Admitting: Cardiology

## 2018-07-10 VITALS — BP 112/70 | HR 96 | Ht 66.0 in | Wt 102.8 lb

## 2018-07-10 DIAGNOSIS — R9431 Abnormal electrocardiogram [ECG] [EKG]: Secondary | ICD-10-CM | POA: Diagnosis not present

## 2018-07-10 MED ORDER — CARVEDILOL 6.25 MG PO TABS: 6.2500 mg | ORAL_TABLET | Freq: Two times a day (BID) | ORAL | 3 refills | Status: AC

## 2018-07-10 NOTE — Progress Notes (Signed)
Cardiology Office Note:    Date:  07/10/2018   ID:  Sheena Adams, DOB 07-Apr-1941, MRN [id number]  PCP:  Jethro Bastos, MD  Cardiologist:  No primary care provider on file.  Electrophysiologist:  None   Referring MD: No ref. provider found     History of Present Illness:    Sheena Adams is a 77 y.o. female here for evaluation of abnormal EKG with syncopal episodes when upright on toilet/shower chair at the request of Dr. Allena Katz.  She tells me that she remember feeling weak, pale while getting her hair done.  EKG personally reviewed from 08/24/1991 shows sinus rhythm with first-degree AV block 210 ms with poor R wave progression baseline wander possible old anterior infarct pattern.  I would assume these dates are incorrect on the top of the EKG and I do see a handwritten date of 04/27/2018 at the bottom of the EKG.  She is not able to ambulate on her own without assistance.  She is quite thin.  Blood pressure on the low side.  She did not have frank syncope.  She denies any chest pain.  No significant shortness of breath.    She believes that her father had similar episodes.   History reviewed. No pertinent past medical history.  History reviewed. No pertinent surgical history.  Current Medications: Current Meds  Medication Sig  . acetaminophen (TYLENOL) 325 MG tablet Take 650 mg by mouth 3 (three) times daily.  . busPIRone (BUSPAR) 10 MG tablet Take 10 mg by mouth 2 (two) times daily.  . cycloSPORINE (RESTASIS) 0.05 % ophthalmic emulsion Place 1 drop into both eyes 2 (two) times daily.  Marland Kitchen docusate sodium (COLACE) 100 MG capsule Take 100 mg by mouth daily.  . DULoxetine (CYMBALTA) 60 MG capsule Take 60 mg by mouth daily.  Marland Kitchen guaiFENesin (ROBITUSSIN) 100 MG/5ML liquid Take 10 mLs by mouth every 4 (four) hours as needed for cough.  . hydrocortisone (ANUSOL-HC) 2.5 % rectal cream Place 1 application rectally as needed for hemorrhoids or anal itching.  . hydrocortisone  (ANUSOL-HC) 25 MG suppository Place 25 mg rectally as needed for hemorrhoids or anal itching.  . hydroxychloroquine (PLAQUENIL) 200 MG tablet Take 200 mg by mouth daily.  Marland Kitchen leflunomide (ARAVA) 20 MG tablet Take 20 mg by mouth daily.  Marland Kitchen levofloxacin (LEVAQUIN) 250 MG tablet Take 250 mg by mouth daily.  . miconazole (MICOTIN) 2 % cream Apply 1 application topically 2 (two) times daily.  . ondansetron (ZOFRAN) 4 MG tablet Take 4 mg by mouth every 8 (eight) hours as needed for nausea or vomiting.  Marland Kitchen oxyCODONE-acetaminophen (PERCOCET/ROXICET) 5-325 MG tablet Take 1 tablet by mouth every 12 (twelve) hours as needed for severe pain.  . Probiotic Product (RISA-BID PROBIOTIC PO) Take 250 mg by mouth 2 (two) times daily.  . ranitidine (ZANTAC) 75 MG tablet Take 75 mg by mouth 2 (two) times daily.  . traZODone (DESYREL) 100 MG tablet Take 100 mg by mouth at bedtime.     Allergies:   Patient has no allergy information on record.   Social History   Socioeconomic History  . Marital status: Widowed    Spouse name: Not on file  . Number of children: Not on file  . Years of education: Not on file  . Highest education level: Not on file  Occupational History  . Not on file  Social Needs  . Financial resource strain: Not on file  . Food insecurity:    Worry: Not  on file    Inability: Not on file  . Transportation needs:    Medical: Not on file    Non-medical: Not on file  Tobacco Use  . Smoking status: Not on file  Substance and Sexual Activity  . Alcohol use: Not on file  . Drug use: Not on file  . Sexual activity: Not on file  Lifestyle  . Physical activity:    Days per week: Not on file    Minutes per session: Not on file  . Stress: Not on file  Relationships  . Social connections:    Talks on phone: Not on file    Gets together: Not on file    Attends religious service: Not on file    Active member of club or organization: Not on file    Attends meetings of clubs or organizations:  Not on file    Relationship status: Not on file  Other Topics Concern  . Not on file  Social History Narrative  . Not on file     Family History: The patient's family history is not on file.  No early family history of CAD  ROS:   Please see the history of present illness.     All other systems reviewed and are negative.  EKGs/Labs/Other Studies Reviewed:    The following studies were reviewed today: Prior EKGs, some records from nursing home  EKG:  EKG is  ordered today.  The ekg ordered today demonstrates 07/10/2018-sinus rhythm 96 left anterior fascicular block with anterolateral infarct pattern.  Recent Labs: No results found for requested labs within last 8760 hours.  Recent Lipid Panel No results found for: CHOL, TRIG, HDL, CHOLHDL, VLDL, LDLCALC, LDLDIRECT  Physical Exam:    VS:  BP 112/70   Pulse 96   Ht 5\' 6"  (1.676 m)   Wt 102 lb 12.8 oz (46.6 kg)   BMI 16.59 kg/m     Wt Readings from Last 3 Encounters:  07/10/18 102 lb 12.8 oz (46.6 kg)     GEN: Thin in no acute distress HEENT: Normal NECK: No JVD; No carotid bruits LYMPHATICS: No lymphadenopathy CARDIAC: RRR, no murmurs, rubs, gallops RESPIRATORY:  Clear to auscultation without rales, wheezing or rhonchi  ABDOMEN: Soft, non-tender, non-distended MUSCULOSKELETAL:  No edema; No deformity  SKIN: Warm and dry NEUROLOGIC:  Alert  PSYCHIATRIC:  Normal affect   ASSESSMENT:    1. Abnormal EKG    PLAN:    In order of problems listed above:  Near syncope - She is currently in a wheelchair.  Needed assistance getting up to get on the scale. - My suspicion is that common things being common, her syncopal episode was secondary to an orthostatic hypotension episode/autonomic dysfunction.  Had some surrounding vagal-like symptoms.  I am inferring this from her medication list which states losartan 25 mg-hold medication if systolic blood pressure less than 100.  She is also on carvedilol 12.5 mg twice a day.   My suggestion is that we discontinue the losartan. - I would allow her to have permissive hypertension because of potential autonomic dysfunction. - Given her baseline current medical state, I would not pursue any further testing at this time.  I think would be challenging for her to comply with an event monitor. - It would make sense for her to also liberalize her salt intake and adequately hydrate as well. -Compression hose can be helpful as well to increase blood pressure.   Abnormal EKG -Sinus rhythm LAFB with poor  R wave progression possible anterolateral infarct pattern.  At this time, I would hold off on getting an echocardiogram.  She is not having any shortness of breath, no chest pain.  I think that if conservative management does not help her, we can always check an echocardiogram in the future.  Decreasing her carvedilol will also be helpful in the setting of her minimal first-degree AV block.  Dementia - Stable.  She did remember having the near syncopal episode.  Medication Adjustments/Labs and Tests Ordered: Current medicines are reviewed at length with the patient today.  Concerns regarding medicines are outlined above.  Orders Placed This Encounter  Procedures  . EKG 12-Lead   Meds ordered this encounter  Medications  . carvedilol (COREG) 6.25 MG tablet    Sig: Take 1 tablet (6.25 mg total) by mouth 2 (two) times daily.    Dispense:  180 tablet    Refill:  3    Patient Instructions  Medication Instructions:  Please stop your Losartan. Decrease your Carvedilol to 6.25 mg twice a day. Continue all other medications as listed.  Wear knee high compression stockings daily (20-30 mmhg) Increase hydration and sodium intake.  Follow-Up: Follow up as needed with Dr Anne Fu.  Thank you for choosing [facility]!!         Signed, Donato Schultz, MD  07/10/2018 3:07 PM    [facility]

## 2018-07-10 NOTE — Patient Instructions (Signed)
Medication Instructions:  Please stop your Losartan. Decrease your Carvedilol to 6.25 mg twice a day. Continue all other medications as listed.  Wear knee high compression stockings daily (20-30 mmhg) Increase hydration and sodium intake.  Follow-Up: Follow up as needed with Dr Anne Fu.  Thank you for choosing [facility]!!

## 2018-07-19 DIAGNOSIS — D649 Anemia, unspecified: Secondary | ICD-10-CM | POA: Diagnosis not present

## 2018-07-19 DIAGNOSIS — I10 Essential (primary) hypertension: Secondary | ICD-10-CM | POA: Diagnosis not present

## 2018-07-26 DIAGNOSIS — G894 Chronic pain syndrome: Secondary | ICD-10-CM | POA: Diagnosis not present

## 2018-07-26 DIAGNOSIS — M059 Rheumatoid arthritis with rheumatoid factor, unspecified: Secondary | ICD-10-CM | POA: Diagnosis not present

## 2018-07-26 DIAGNOSIS — M797 Fibromyalgia: Secondary | ICD-10-CM | POA: Diagnosis not present

## 2018-07-31 DIAGNOSIS — R110 Nausea: Secondary | ICD-10-CM | POA: Diagnosis not present

## 2018-07-31 DIAGNOSIS — M797 Fibromyalgia: Secondary | ICD-10-CM | POA: Diagnosis not present

## 2018-07-31 DIAGNOSIS — K219 Gastro-esophageal reflux disease without esophagitis: Secondary | ICD-10-CM | POA: Diagnosis not present

## 2018-07-31 DIAGNOSIS — F323 Major depressive disorder, single episode, severe with psychotic features: Secondary | ICD-10-CM | POA: Diagnosis not present

## 2018-07-31 DIAGNOSIS — F419 Anxiety disorder, unspecified: Secondary | ICD-10-CM | POA: Diagnosis not present

## 2018-07-31 DIAGNOSIS — M069 Rheumatoid arthritis, unspecified: Secondary | ICD-10-CM | POA: Diagnosis not present

## 2018-07-31 DIAGNOSIS — M6281 Muscle weakness (generalized): Secondary | ICD-10-CM | POA: Diagnosis not present

## 2018-07-31 DIAGNOSIS — H04129 Dry eye syndrome of unspecified lacrimal gland: Secondary | ICD-10-CM | POA: Diagnosis not present

## 2018-07-31 DIAGNOSIS — K5900 Constipation, unspecified: Secondary | ICD-10-CM | POA: Diagnosis not present

## 2018-08-10 DIAGNOSIS — R300 Dysuria: Secondary | ICD-10-CM | POA: Diagnosis not present

## 2018-08-11 DIAGNOSIS — R319 Hematuria, unspecified: Secondary | ICD-10-CM | POA: Diagnosis not present

## 2018-08-11 DIAGNOSIS — N390 Urinary tract infection, site not specified: Secondary | ICD-10-CM | POA: Diagnosis not present

## 2018-08-11 DIAGNOSIS — Z79899 Other long term (current) drug therapy: Secondary | ICD-10-CM | POA: Diagnosis not present

## 2018-08-14 DIAGNOSIS — N390 Urinary tract infection, site not specified: Secondary | ICD-10-CM | POA: Diagnosis not present

## 2018-08-31 DIAGNOSIS — F323 Major depressive disorder, single episode, severe with psychotic features: Secondary | ICD-10-CM | POA: Diagnosis not present

## 2018-08-31 DIAGNOSIS — H04129 Dry eye syndrome of unspecified lacrimal gland: Secondary | ICD-10-CM | POA: Diagnosis not present

## 2018-08-31 DIAGNOSIS — M069 Rheumatoid arthritis, unspecified: Secondary | ICD-10-CM | POA: Diagnosis not present

## 2018-08-31 DIAGNOSIS — K219 Gastro-esophageal reflux disease without esophagitis: Secondary | ICD-10-CM | POA: Diagnosis not present

## 2018-08-31 DIAGNOSIS — F419 Anxiety disorder, unspecified: Secondary | ICD-10-CM | POA: Diagnosis not present

## 2018-08-31 DIAGNOSIS — M6281 Muscle weakness (generalized): Secondary | ICD-10-CM | POA: Diagnosis not present

## 2018-08-31 DIAGNOSIS — I10 Essential (primary) hypertension: Secondary | ICD-10-CM | POA: Diagnosis not present

## 2018-08-31 DIAGNOSIS — M797 Fibromyalgia: Secondary | ICD-10-CM | POA: Diagnosis not present

## 2018-08-31 DIAGNOSIS — K5900 Constipation, unspecified: Secondary | ICD-10-CM | POA: Diagnosis not present

## 2018-08-31 DIAGNOSIS — R110 Nausea: Secondary | ICD-10-CM | POA: Diagnosis not present

## 2018-09-08 DIAGNOSIS — G894 Chronic pain syndrome: Secondary | ICD-10-CM | POA: Diagnosis not present

## 2018-09-08 DIAGNOSIS — M059 Rheumatoid arthritis with rheumatoid factor, unspecified: Secondary | ICD-10-CM | POA: Diagnosis not present

## 2018-09-08 DIAGNOSIS — M797 Fibromyalgia: Secondary | ICD-10-CM | POA: Diagnosis not present

## 2018-09-25 DIAGNOSIS — R319 Hematuria, unspecified: Secondary | ICD-10-CM | POA: Diagnosis not present

## 2018-09-25 DIAGNOSIS — Z79899 Other long term (current) drug therapy: Secondary | ICD-10-CM | POA: Diagnosis not present

## 2018-09-25 DIAGNOSIS — N390 Urinary tract infection, site not specified: Secondary | ICD-10-CM | POA: Diagnosis not present

## 2018-09-29 DIAGNOSIS — N390 Urinary tract infection, site not specified: Secondary | ICD-10-CM | POA: Diagnosis not present

## 2018-10-04 DIAGNOSIS — Z79899 Other long term (current) drug therapy: Secondary | ICD-10-CM | POA: Diagnosis not present

## 2018-10-04 DIAGNOSIS — H524 Presbyopia: Secondary | ICD-10-CM | POA: Diagnosis not present

## 2018-10-04 DIAGNOSIS — H2513 Age-related nuclear cataract, bilateral: Secondary | ICD-10-CM | POA: Diagnosis not present

## 2018-10-11 DIAGNOSIS — M059 Rheumatoid arthritis with rheumatoid factor, unspecified: Secondary | ICD-10-CM | POA: Diagnosis not present

## 2018-10-11 DIAGNOSIS — G894 Chronic pain syndrome: Secondary | ICD-10-CM | POA: Diagnosis not present

## 2018-10-11 DIAGNOSIS — M797 Fibromyalgia: Secondary | ICD-10-CM | POA: Diagnosis not present

## 2018-10-18 DIAGNOSIS — R300 Dysuria: Secondary | ICD-10-CM | POA: Diagnosis not present

## 2018-10-19 DIAGNOSIS — N390 Urinary tract infection, site not specified: Secondary | ICD-10-CM | POA: Diagnosis not present

## 2018-10-19 DIAGNOSIS — R319 Hematuria, unspecified: Secondary | ICD-10-CM | POA: Diagnosis not present

## 2018-10-19 DIAGNOSIS — M6281 Muscle weakness (generalized): Secondary | ICD-10-CM | POA: Diagnosis not present

## 2018-10-19 DIAGNOSIS — K219 Gastro-esophageal reflux disease without esophagitis: Secondary | ICD-10-CM | POA: Diagnosis not present

## 2018-10-19 DIAGNOSIS — K5900 Constipation, unspecified: Secondary | ICD-10-CM | POA: Diagnosis not present

## 2018-10-19 DIAGNOSIS — F419 Anxiety disorder, unspecified: Secondary | ICD-10-CM | POA: Diagnosis not present

## 2018-10-19 DIAGNOSIS — F0390 Unspecified dementia without behavioral disturbance: Secondary | ICD-10-CM | POA: Diagnosis not present

## 2018-10-19 DIAGNOSIS — H04129 Dry eye syndrome of unspecified lacrimal gland: Secondary | ICD-10-CM | POA: Diagnosis not present

## 2018-10-19 DIAGNOSIS — F323 Major depressive disorder, single episode, severe with psychotic features: Secondary | ICD-10-CM | POA: Diagnosis not present

## 2018-10-19 DIAGNOSIS — R54 Age-related physical debility: Secondary | ICD-10-CM | POA: Diagnosis not present

## 2018-10-19 DIAGNOSIS — G8929 Other chronic pain: Secondary | ICD-10-CM | POA: Diagnosis not present

## 2018-10-19 DIAGNOSIS — M069 Rheumatoid arthritis, unspecified: Secondary | ICD-10-CM | POA: Diagnosis not present

## 2018-10-19 DIAGNOSIS — M797 Fibromyalgia: Secondary | ICD-10-CM | POA: Diagnosis not present

## 2018-10-19 DIAGNOSIS — I10 Essential (primary) hypertension: Secondary | ICD-10-CM | POA: Diagnosis not present

## 2018-11-02 DIAGNOSIS — H25811 Combined forms of age-related cataract, right eye: Secondary | ICD-10-CM | POA: Diagnosis not present

## 2018-11-02 DIAGNOSIS — H2511 Age-related nuclear cataract, right eye: Secondary | ICD-10-CM | POA: Diagnosis not present

## 2018-11-22 DIAGNOSIS — M6281 Muscle weakness (generalized): Secondary | ICD-10-CM | POA: Diagnosis not present

## 2018-11-22 DIAGNOSIS — I10 Essential (primary) hypertension: Secondary | ICD-10-CM | POA: Diagnosis not present

## 2018-11-24 DIAGNOSIS — I10 Essential (primary) hypertension: Secondary | ICD-10-CM | POA: Diagnosis not present

## 2018-11-24 DIAGNOSIS — M6281 Muscle weakness (generalized): Secondary | ICD-10-CM | POA: Diagnosis not present

## 2018-11-27 DIAGNOSIS — H04129 Dry eye syndrome of unspecified lacrimal gland: Secondary | ICD-10-CM | POA: Diagnosis not present

## 2018-11-27 DIAGNOSIS — K5900 Constipation, unspecified: Secondary | ICD-10-CM | POA: Diagnosis not present

## 2018-11-27 DIAGNOSIS — M6281 Muscle weakness (generalized): Secondary | ICD-10-CM | POA: Diagnosis not present

## 2018-11-27 DIAGNOSIS — M069 Rheumatoid arthritis, unspecified: Secondary | ICD-10-CM | POA: Diagnosis not present

## 2018-11-27 DIAGNOSIS — K648 Other hemorrhoids: Secondary | ICD-10-CM | POA: Diagnosis not present

## 2018-11-27 DIAGNOSIS — F323 Major depressive disorder, single episode, severe with psychotic features: Secondary | ICD-10-CM | POA: Diagnosis not present

## 2018-11-27 DIAGNOSIS — R54 Age-related physical debility: Secondary | ICD-10-CM | POA: Diagnosis not present

## 2018-11-27 DIAGNOSIS — M797 Fibromyalgia: Secondary | ICD-10-CM | POA: Diagnosis not present

## 2018-11-27 DIAGNOSIS — I10 Essential (primary) hypertension: Secondary | ICD-10-CM | POA: Diagnosis not present

## 2018-11-27 DIAGNOSIS — J029 Acute pharyngitis, unspecified: Secondary | ICD-10-CM | POA: Diagnosis not present

## 2018-11-27 DIAGNOSIS — G8929 Other chronic pain: Secondary | ICD-10-CM | POA: Diagnosis not present

## 2018-11-27 DIAGNOSIS — K219 Gastro-esophageal reflux disease without esophagitis: Secondary | ICD-10-CM | POA: Diagnosis not present

## 2018-11-28 DIAGNOSIS — I10 Essential (primary) hypertension: Secondary | ICD-10-CM | POA: Diagnosis not present

## 2018-11-28 DIAGNOSIS — M6281 Muscle weakness (generalized): Secondary | ICD-10-CM | POA: Diagnosis not present

## 2018-11-29 DIAGNOSIS — M6281 Muscle weakness (generalized): Secondary | ICD-10-CM | POA: Diagnosis not present

## 2018-11-29 DIAGNOSIS — I10 Essential (primary) hypertension: Secondary | ICD-10-CM | POA: Diagnosis not present

## 2018-11-30 DIAGNOSIS — I10 Essential (primary) hypertension: Secondary | ICD-10-CM | POA: Diagnosis not present

## 2018-11-30 DIAGNOSIS — M6281 Muscle weakness (generalized): Secondary | ICD-10-CM | POA: Diagnosis not present

## 2018-12-01 DIAGNOSIS — B379 Candidiasis, unspecified: Secondary | ICD-10-CM | POA: Diagnosis not present

## 2018-12-01 DIAGNOSIS — R21 Rash and other nonspecific skin eruption: Secondary | ICD-10-CM | POA: Diagnosis not present

## 2018-12-05 DIAGNOSIS — M6281 Muscle weakness (generalized): Secondary | ICD-10-CM | POA: Diagnosis not present

## 2018-12-05 DIAGNOSIS — I10 Essential (primary) hypertension: Secondary | ICD-10-CM | POA: Diagnosis not present

## 2018-12-07 DIAGNOSIS — I10 Essential (primary) hypertension: Secondary | ICD-10-CM | POA: Diagnosis not present

## 2018-12-07 DIAGNOSIS — M6281 Muscle weakness (generalized): Secondary | ICD-10-CM | POA: Diagnosis not present

## 2018-12-11 DIAGNOSIS — I10 Essential (primary) hypertension: Secondary | ICD-10-CM | POA: Diagnosis not present

## 2018-12-11 DIAGNOSIS — M6281 Muscle weakness (generalized): Secondary | ICD-10-CM | POA: Diagnosis not present

## 2018-12-11 DIAGNOSIS — H25019 Cortical age-related cataract, unspecified eye: Secondary | ICD-10-CM | POA: Diagnosis not present

## 2018-12-12 DIAGNOSIS — K219 Gastro-esophageal reflux disease without esophagitis: Secondary | ICD-10-CM | POA: Diagnosis not present

## 2018-12-12 DIAGNOSIS — K5900 Constipation, unspecified: Secondary | ICD-10-CM | POA: Diagnosis not present

## 2018-12-12 DIAGNOSIS — M069 Rheumatoid arthritis, unspecified: Secondary | ICD-10-CM | POA: Diagnosis not present

## 2018-12-12 DIAGNOSIS — G8929 Other chronic pain: Secondary | ICD-10-CM | POA: Diagnosis not present

## 2018-12-12 DIAGNOSIS — M6281 Muscle weakness (generalized): Secondary | ICD-10-CM | POA: Diagnosis not present

## 2018-12-12 DIAGNOSIS — K648 Other hemorrhoids: Secondary | ICD-10-CM | POA: Diagnosis not present

## 2018-12-12 DIAGNOSIS — I10 Essential (primary) hypertension: Secondary | ICD-10-CM | POA: Diagnosis not present

## 2018-12-12 DIAGNOSIS — H04129 Dry eye syndrome of unspecified lacrimal gland: Secondary | ICD-10-CM | POA: Diagnosis not present

## 2018-12-12 DIAGNOSIS — F323 Major depressive disorder, single episode, severe with psychotic features: Secondary | ICD-10-CM | POA: Diagnosis not present

## 2018-12-12 DIAGNOSIS — R54 Age-related physical debility: Secondary | ICD-10-CM | POA: Diagnosis not present

## 2018-12-12 DIAGNOSIS — J029 Acute pharyngitis, unspecified: Secondary | ICD-10-CM | POA: Diagnosis not present

## 2018-12-12 DIAGNOSIS — M797 Fibromyalgia: Secondary | ICD-10-CM | POA: Diagnosis not present

## 2018-12-14 DIAGNOSIS — I10 Essential (primary) hypertension: Secondary | ICD-10-CM | POA: Diagnosis not present

## 2018-12-14 DIAGNOSIS — M6281 Muscle weakness (generalized): Secondary | ICD-10-CM | POA: Diagnosis not present

## 2018-12-18 DIAGNOSIS — M6281 Muscle weakness (generalized): Secondary | ICD-10-CM | POA: Diagnosis not present

## 2018-12-18 DIAGNOSIS — I10 Essential (primary) hypertension: Secondary | ICD-10-CM | POA: Diagnosis not present

## 2018-12-19 DIAGNOSIS — M6281 Muscle weakness (generalized): Secondary | ICD-10-CM | POA: Diagnosis not present

## 2018-12-19 DIAGNOSIS — I10 Essential (primary) hypertension: Secondary | ICD-10-CM | POA: Diagnosis not present

## 2018-12-20 DIAGNOSIS — E708 Other disorders of aromatic amino-acid metabolism: Secondary | ICD-10-CM | POA: Diagnosis not present

## 2018-12-20 DIAGNOSIS — I10 Essential (primary) hypertension: Secondary | ICD-10-CM | POA: Diagnosis not present

## 2018-12-20 DIAGNOSIS — R54 Age-related physical debility: Secondary | ICD-10-CM | POA: Diagnosis not present

## 2018-12-20 DIAGNOSIS — M6281 Muscle weakness (generalized): Secondary | ICD-10-CM | POA: Diagnosis not present

## 2018-12-20 DIAGNOSIS — N390 Urinary tract infection, site not specified: Secondary | ICD-10-CM | POA: Diagnosis not present

## 2018-12-20 DIAGNOSIS — R300 Dysuria: Secondary | ICD-10-CM | POA: Diagnosis not present

## 2018-12-21 DIAGNOSIS — N390 Urinary tract infection, site not specified: Secondary | ICD-10-CM | POA: Diagnosis not present

## 2018-12-25 DIAGNOSIS — M6281 Muscle weakness (generalized): Secondary | ICD-10-CM | POA: Diagnosis not present

## 2018-12-25 DIAGNOSIS — I10 Essential (primary) hypertension: Secondary | ICD-10-CM | POA: Diagnosis not present

## 2018-12-26 DIAGNOSIS — I10 Essential (primary) hypertension: Secondary | ICD-10-CM | POA: Diagnosis not present

## 2018-12-26 DIAGNOSIS — M6281 Muscle weakness (generalized): Secondary | ICD-10-CM | POA: Diagnosis not present

## 2018-12-28 DIAGNOSIS — I10 Essential (primary) hypertension: Secondary | ICD-10-CM | POA: Diagnosis not present

## 2018-12-28 DIAGNOSIS — M6281 Muscle weakness (generalized): Secondary | ICD-10-CM | POA: Diagnosis not present

## 2018-12-29 DIAGNOSIS — M6281 Muscle weakness (generalized): Secondary | ICD-10-CM | POA: Diagnosis not present

## 2019-01-10 DIAGNOSIS — K5900 Constipation, unspecified: Secondary | ICD-10-CM | POA: Diagnosis not present

## 2019-01-10 DIAGNOSIS — J029 Acute pharyngitis, unspecified: Secondary | ICD-10-CM | POA: Diagnosis not present

## 2019-01-10 DIAGNOSIS — I10 Essential (primary) hypertension: Secondary | ICD-10-CM | POA: Diagnosis not present

## 2019-01-10 DIAGNOSIS — R54 Age-related physical debility: Secondary | ICD-10-CM | POA: Diagnosis not present

## 2019-01-10 DIAGNOSIS — F323 Major depressive disorder, single episode, severe with psychotic features: Secondary | ICD-10-CM | POA: Diagnosis not present

## 2019-01-10 DIAGNOSIS — G8929 Other chronic pain: Secondary | ICD-10-CM | POA: Diagnosis not present

## 2019-01-10 DIAGNOSIS — K648 Other hemorrhoids: Secondary | ICD-10-CM | POA: Diagnosis not present

## 2019-01-10 DIAGNOSIS — K219 Gastro-esophageal reflux disease without esophagitis: Secondary | ICD-10-CM | POA: Diagnosis not present

## 2019-01-10 DIAGNOSIS — H04129 Dry eye syndrome of unspecified lacrimal gland: Secondary | ICD-10-CM | POA: Diagnosis not present

## 2019-01-10 DIAGNOSIS — M6281 Muscle weakness (generalized): Secondary | ICD-10-CM | POA: Diagnosis not present

## 2019-01-10 DIAGNOSIS — M797 Fibromyalgia: Secondary | ICD-10-CM | POA: Diagnosis not present

## 2019-01-10 DIAGNOSIS — M069 Rheumatoid arthritis, unspecified: Secondary | ICD-10-CM | POA: Diagnosis not present

## 2019-01-22 DIAGNOSIS — M797 Fibromyalgia: Secondary | ICD-10-CM | POA: Diagnosis not present

## 2019-01-22 DIAGNOSIS — M059 Rheumatoid arthritis with rheumatoid factor, unspecified: Secondary | ICD-10-CM | POA: Diagnosis not present

## 2019-01-22 DIAGNOSIS — G894 Chronic pain syndrome: Secondary | ICD-10-CM | POA: Diagnosis not present

## 2019-02-20 DIAGNOSIS — G8929 Other chronic pain: Secondary | ICD-10-CM | POA: Diagnosis not present

## 2019-02-20 DIAGNOSIS — F0390 Unspecified dementia without behavioral disturbance: Secondary | ICD-10-CM | POA: Diagnosis not present

## 2019-02-20 DIAGNOSIS — K219 Gastro-esophageal reflux disease without esophagitis: Secondary | ICD-10-CM | POA: Diagnosis not present

## 2019-02-20 DIAGNOSIS — R54 Age-related physical debility: Secondary | ICD-10-CM | POA: Diagnosis not present

## 2019-02-20 DIAGNOSIS — M069 Rheumatoid arthritis, unspecified: Secondary | ICD-10-CM | POA: Diagnosis not present

## 2019-02-20 DIAGNOSIS — F323 Major depressive disorder, single episode, severe with psychotic features: Secondary | ICD-10-CM | POA: Diagnosis not present

## 2019-02-20 DIAGNOSIS — I10 Essential (primary) hypertension: Secondary | ICD-10-CM | POA: Diagnosis not present

## 2019-02-20 DIAGNOSIS — H04129 Dry eye syndrome of unspecified lacrimal gland: Secondary | ICD-10-CM | POA: Diagnosis not present

## 2019-02-20 DIAGNOSIS — E440 Moderate protein-calorie malnutrition: Secondary | ICD-10-CM | POA: Diagnosis not present

## 2019-02-20 DIAGNOSIS — F419 Anxiety disorder, unspecified: Secondary | ICD-10-CM | POA: Diagnosis not present

## 2019-02-20 DIAGNOSIS — K648 Other hemorrhoids: Secondary | ICD-10-CM | POA: Diagnosis not present

## 2019-03-01 DIAGNOSIS — I10 Essential (primary) hypertension: Secondary | ICD-10-CM | POA: Diagnosis not present

## 2019-03-01 DIAGNOSIS — D649 Anemia, unspecified: Secondary | ICD-10-CM | POA: Diagnosis not present

## 2019-03-01 DIAGNOSIS — I509 Heart failure, unspecified: Secondary | ICD-10-CM | POA: Diagnosis not present

## 2019-03-01 DIAGNOSIS — E559 Vitamin D deficiency, unspecified: Secondary | ICD-10-CM | POA: Diagnosis not present

## 2019-03-01 DIAGNOSIS — E039 Hypothyroidism, unspecified: Secondary | ICD-10-CM | POA: Diagnosis not present

## 2019-03-09 DIAGNOSIS — K648 Other hemorrhoids: Secondary | ICD-10-CM | POA: Diagnosis not present

## 2019-03-09 DIAGNOSIS — R197 Diarrhea, unspecified: Secondary | ICD-10-CM | POA: Diagnosis not present

## 2019-03-09 DIAGNOSIS — R05 Cough: Secondary | ICD-10-CM | POA: Diagnosis not present

## 2019-03-09 DIAGNOSIS — M069 Rheumatoid arthritis, unspecified: Secondary | ICD-10-CM | POA: Diagnosis not present

## 2019-03-09 DIAGNOSIS — K219 Gastro-esophageal reflux disease without esophagitis: Secondary | ICD-10-CM | POA: Diagnosis not present

## 2019-03-09 DIAGNOSIS — H04129 Dry eye syndrome of unspecified lacrimal gland: Secondary | ICD-10-CM | POA: Diagnosis not present

## 2019-03-09 DIAGNOSIS — I10 Essential (primary) hypertension: Secondary | ICD-10-CM | POA: Diagnosis not present

## 2019-03-09 DIAGNOSIS — F329 Major depressive disorder, single episode, unspecified: Secondary | ICD-10-CM | POA: Diagnosis not present

## 2019-03-09 DIAGNOSIS — E559 Vitamin D deficiency, unspecified: Secondary | ICD-10-CM | POA: Diagnosis not present

## 2019-03-09 DIAGNOSIS — R110 Nausea: Secondary | ICD-10-CM | POA: Diagnosis not present

## 2019-03-09 DIAGNOSIS — G8929 Other chronic pain: Secondary | ICD-10-CM | POA: Diagnosis not present

## 2019-03-09 DIAGNOSIS — R21 Rash and other nonspecific skin eruption: Secondary | ICD-10-CM | POA: Diagnosis not present

## 2019-04-03 DIAGNOSIS — L98411 Non-pressure chronic ulcer of buttock limited to breakdown of skin: Secondary | ICD-10-CM | POA: Diagnosis not present

## 2019-04-03 DIAGNOSIS — M79643 Pain in unspecified hand: Secondary | ICD-10-CM | POA: Diagnosis not present

## 2019-04-03 DIAGNOSIS — M06849 Other specified rheumatoid arthritis, unspecified hand: Secondary | ICD-10-CM | POA: Diagnosis not present

## 2019-04-03 DIAGNOSIS — R197 Diarrhea, unspecified: Secondary | ICD-10-CM | POA: Diagnosis not present

## 2019-04-06 DIAGNOSIS — A0472 Enterocolitis due to Clostridium difficile, not specified as recurrent: Secondary | ICD-10-CM | POA: Diagnosis not present

## 2019-04-06 DIAGNOSIS — R197 Diarrhea, unspecified: Secondary | ICD-10-CM | POA: Diagnosis not present

## 2019-04-11 DIAGNOSIS — K648 Other hemorrhoids: Secondary | ICD-10-CM | POA: Diagnosis not present

## 2019-04-11 DIAGNOSIS — H04129 Dry eye syndrome of unspecified lacrimal gland: Secondary | ICD-10-CM | POA: Diagnosis not present

## 2019-04-11 DIAGNOSIS — G8929 Other chronic pain: Secondary | ICD-10-CM | POA: Diagnosis not present

## 2019-04-11 DIAGNOSIS — R110 Nausea: Secondary | ICD-10-CM | POA: Diagnosis not present

## 2019-04-11 DIAGNOSIS — M79643 Pain in unspecified hand: Secondary | ICD-10-CM | POA: Diagnosis not present

## 2019-04-11 DIAGNOSIS — L98411 Non-pressure chronic ulcer of buttock limited to breakdown of skin: Secondary | ICD-10-CM | POA: Diagnosis not present

## 2019-04-11 DIAGNOSIS — M06849 Other specified rheumatoid arthritis, unspecified hand: Secondary | ICD-10-CM | POA: Diagnosis not present

## 2019-04-11 DIAGNOSIS — K219 Gastro-esophageal reflux disease without esophagitis: Secondary | ICD-10-CM | POA: Diagnosis not present

## 2019-04-11 DIAGNOSIS — R05 Cough: Secondary | ICD-10-CM | POA: Diagnosis not present

## 2019-04-11 DIAGNOSIS — I10 Essential (primary) hypertension: Secondary | ICD-10-CM | POA: Diagnosis not present

## 2019-04-11 DIAGNOSIS — R197 Diarrhea, unspecified: Secondary | ICD-10-CM | POA: Diagnosis not present

## 2019-04-11 DIAGNOSIS — E559 Vitamin D deficiency, unspecified: Secondary | ICD-10-CM | POA: Diagnosis not present

## 2019-04-13 DIAGNOSIS — H04123 Dry eye syndrome of bilateral lacrimal glands: Secondary | ICD-10-CM | POA: Diagnosis not present

## 2019-04-13 DIAGNOSIS — M0549 Rheumatoid myopathy with rheumatoid arthritis of multiple sites: Secondary | ICD-10-CM | POA: Diagnosis not present

## 2019-04-13 DIAGNOSIS — F3289 Other specified depressive episodes: Secondary | ICD-10-CM | POA: Diagnosis not present

## 2019-04-13 DIAGNOSIS — E559 Vitamin D deficiency, unspecified: Secondary | ICD-10-CM | POA: Diagnosis not present

## 2019-04-13 DIAGNOSIS — I10 Essential (primary) hypertension: Secondary | ICD-10-CM | POA: Diagnosis not present

## 2019-04-13 DIAGNOSIS — R197 Diarrhea, unspecified: Secondary | ICD-10-CM | POA: Diagnosis not present

## 2019-04-13 DIAGNOSIS — R5382 Chronic fatigue, unspecified: Secondary | ICD-10-CM | POA: Diagnosis not present

## 2019-04-13 DIAGNOSIS — R21 Rash and other nonspecific skin eruption: Secondary | ICD-10-CM | POA: Diagnosis not present

## 2019-04-13 DIAGNOSIS — R54 Age-related physical debility: Secondary | ICD-10-CM | POA: Diagnosis not present

## 2019-04-13 DIAGNOSIS — R636 Underweight: Secondary | ICD-10-CM | POA: Diagnosis not present

## 2019-04-13 DIAGNOSIS — M6281 Muscle weakness (generalized): Secondary | ICD-10-CM | POA: Diagnosis not present

## 2019-04-13 DIAGNOSIS — G8929 Other chronic pain: Secondary | ICD-10-CM | POA: Diagnosis not present

## 2019-04-13 DIAGNOSIS — M0680 Other specified rheumatoid arthritis, unspecified site: Secondary | ICD-10-CM | POA: Diagnosis not present

## 2019-04-13 DIAGNOSIS — K219 Gastro-esophageal reflux disease without esophagitis: Secondary | ICD-10-CM | POA: Diagnosis not present

## 2019-04-18 DIAGNOSIS — Z79899 Other long term (current) drug therapy: Secondary | ICD-10-CM | POA: Diagnosis not present

## 2019-04-18 DIAGNOSIS — M150 Primary generalized (osteo)arthritis: Secondary | ICD-10-CM | POA: Diagnosis not present

## 2019-04-18 DIAGNOSIS — M154 Erosive (osteo)arthritis: Secondary | ICD-10-CM | POA: Diagnosis not present

## 2019-04-18 DIAGNOSIS — M0579 Rheumatoid arthritis with rheumatoid factor of multiple sites without organ or systems involvement: Secondary | ICD-10-CM | POA: Diagnosis not present

## 2019-04-18 DIAGNOSIS — M2550 Pain in unspecified joint: Secondary | ICD-10-CM | POA: Diagnosis not present

## 2019-04-19 DIAGNOSIS — Z1159 Encounter for screening for other viral diseases: Secondary | ICD-10-CM | POA: Diagnosis not present

## 2019-04-23 ENCOUNTER — Telehealth: Payer: Self-pay | Admitting: Gastroenterology

## 2019-04-23 NOTE — Telephone Encounter (Signed)
DOD April 23, 2019  Dr. Havery Moros, there is a referral from [facility] to schedule pt for diarrhea--neg C. diff.  Records from retirement home will be sent to you for review.  Please advise scheduling.

## 2019-05-03 DIAGNOSIS — Z03818 Encounter for observation for suspected exposure to other biological agents ruled out: Secondary | ICD-10-CM | POA: Diagnosis not present

## 2019-05-07 DIAGNOSIS — R05 Cough: Secondary | ICD-10-CM | POA: Diagnosis not present

## 2019-05-08 ENCOUNTER — Ambulatory Visit: Payer: Medicare Other | Admitting: Gastroenterology

## 2019-05-08 NOTE — Progress Notes (Deleted)
HPI :  78 y/o female with a history of GERD, rheumatoid artheritis, referred by Jethro Bastos MD for diarrhea.    Past Medical History:  Diagnosis Date  . Anxiety disorder, unspecified   . Constipation   . Diaphragmatic hernia without obstruction   . Difficulty walking   . Dry eyes   . Encephalopathy, unspecified   . Essential hypertension, benign   . Fibromyalgia   . GERD (gastroesophageal reflux disease)   . Hemorrhoids   . History of falling   . Hypokalemia   . Major depressive disorder with single episode   . Mononeuropathy of lower extremity, left   . Rheumatoid arthritis (HCC)   . Vitamin deficiency    Vitamin D     No past surgical history on file. No family history on file. Social History   Tobacco Use  . Smoking status: Never Smoker  Substance Use Topics  . Alcohol use: Never    Frequency: Never  . Drug use: Never   Current Outpatient Medications  Medication Sig Dispense Refill  . acetaminophen (TYLENOL) 325 MG tablet Take 650 mg by mouth 3 (three) times daily.    . busPIRone (BUSPAR) 10 MG tablet Take 10 mg by mouth 2 (two) times daily.    . carvedilol (COREG) 6.25 MG tablet Take 1 tablet (6.25 mg total) by mouth 2 (two) times daily. 180 tablet 3  . cycloSPORINE (RESTASIS) 0.05 % ophthalmic emulsion Place 1 drop into both eyes 2 (two) times daily.    Marland Kitchen docusate sodium (COLACE) 100 MG capsule Take 100 mg by mouth daily.    . DULoxetine (CYMBALTA) 60 MG capsule Take 60 mg by mouth daily.    Marland Kitchen guaiFENesin (ROBITUSSIN) 100 MG/5ML liquid Take 10 mLs by mouth every 4 (four) hours as needed for cough.    . hydrocortisone (ANUSOL-HC) 2.5 % rectal cream Place 1 application rectally as needed for hemorrhoids or anal itching.    . hydrocortisone (ANUSOL-HC) 25 MG suppository Place 25 mg rectally as needed for hemorrhoids or anal itching.    . hydroxychloroquine (PLAQUENIL) 200 MG tablet Take 200 mg by mouth daily.    Marland Kitchen leflunomide (ARAVA) 20 MG tablet Take 20 mg  by mouth daily.    Marland Kitchen levofloxacin (LEVAQUIN) 250 MG tablet Take 250 mg by mouth daily.    . miconazole (MICOTIN) 2 % cream Apply 1 application topically 2 (two) times daily.    . ondansetron (ZOFRAN) 4 MG tablet Take 4 mg by mouth every 8 (eight) hours as needed for nausea or vomiting.    Marland Kitchen oxyCODONE-acetaminophen (PERCOCET/ROXICET) 5-325 MG tablet Take 1 tablet by mouth every 12 (twelve) hours as needed for severe pain.    . Probiotic Product (RISA-BID PROBIOTIC PO) Take 250 mg by mouth 2 (two) times daily.    . ranitidine (ZANTAC) 75 MG tablet Take 75 mg by mouth 2 (two) times daily.    . traZODone (DESYREL) 100 MG tablet Take 100 mg by mouth at bedtime.     No current facility-administered medications for this visit.    Allergies  Allergen Reactions  . Ace Inhibitors Cough  . Biaxin [Clarithromycin] Diarrhea  . Lyrica [Pregabalin] Other (See Comments)    angioedema  . Sulfa Antibiotics Rash     Review of Systems: All systems reviewed and negative except where noted in HPI.    No results found.  Physical Exam: There were no vitals taken for this visit. Constitutional: Pleasant,well-developed, ***female in no acute distress. HEENT: Normocephalic  and atraumatic. Conjunctivae are normal. No scleral icterus. Neck supple.  Cardiovascular: Normal rate, regular rhythm.  Pulmonary/chest: Effort normal and breath sounds normal. No wheezing, rales or rhonchi. Abdominal: Soft, nondistended, nontender. Bowel sounds active throughout. There are no masses palpable. No hepatomegaly. Extremities: no edema Lymphadenopathy: No cervical adenopathy noted. Neurological: Alert and oriented to person place and time. Skin: Skin is warm and dry. No rashes noted. Psychiatric: Normal mood and affect. Behavior is normal.   ASSESSMENT AND PLAN:  Daylene Posey, MD

## 2019-05-16 DIAGNOSIS — R197 Diarrhea, unspecified: Secondary | ICD-10-CM | POA: Diagnosis not present

## 2019-05-16 DIAGNOSIS — I10 Essential (primary) hypertension: Secondary | ICD-10-CM | POA: Diagnosis not present

## 2019-05-16 DIAGNOSIS — R5382 Chronic fatigue, unspecified: Secondary | ICD-10-CM | POA: Diagnosis not present

## 2019-05-16 DIAGNOSIS — F3289 Other specified depressive episodes: Secondary | ICD-10-CM | POA: Diagnosis not present

## 2019-05-16 DIAGNOSIS — E559 Vitamin D deficiency, unspecified: Secondary | ICD-10-CM | POA: Diagnosis not present

## 2019-05-16 DIAGNOSIS — R21 Rash and other nonspecific skin eruption: Secondary | ICD-10-CM | POA: Diagnosis not present

## 2019-05-16 DIAGNOSIS — K219 Gastro-esophageal reflux disease without esophagitis: Secondary | ICD-10-CM | POA: Diagnosis not present

## 2019-05-16 DIAGNOSIS — G8929 Other chronic pain: Secondary | ICD-10-CM | POA: Diagnosis not present

## 2019-05-16 DIAGNOSIS — H04123 Dry eye syndrome of bilateral lacrimal glands: Secondary | ICD-10-CM | POA: Diagnosis not present

## 2019-05-16 DIAGNOSIS — R636 Underweight: Secondary | ICD-10-CM | POA: Diagnosis not present

## 2019-05-16 DIAGNOSIS — M0680 Other specified rheumatoid arthritis, unspecified site: Secondary | ICD-10-CM | POA: Diagnosis not present

## 2019-05-16 DIAGNOSIS — R54 Age-related physical debility: Secondary | ICD-10-CM | POA: Diagnosis not present

## 2019-05-17 DIAGNOSIS — I509 Heart failure, unspecified: Secondary | ICD-10-CM | POA: Diagnosis not present

## 2019-05-17 DIAGNOSIS — I10 Essential (primary) hypertension: Secondary | ICD-10-CM | POA: Diagnosis not present

## 2019-05-25 DIAGNOSIS — R21 Rash and other nonspecific skin eruption: Secondary | ICD-10-CM | POA: Diagnosis not present

## 2019-05-25 DIAGNOSIS — R600 Localized edema: Secondary | ICD-10-CM | POA: Diagnosis not present

## 2019-06-01 DIAGNOSIS — G8929 Other chronic pain: Secondary | ICD-10-CM | POA: Diagnosis not present

## 2019-06-05 DIAGNOSIS — Z1159 Encounter for screening for other viral diseases: Secondary | ICD-10-CM | POA: Diagnosis not present

## 2019-06-13 ENCOUNTER — Ambulatory Visit (INDEPENDENT_AMBULATORY_CARE_PROVIDER_SITE_OTHER): Payer: Medicare Other | Admitting: Gastroenterology

## 2019-06-13 ENCOUNTER — Telehealth: Payer: Self-pay | Admitting: Gastroenterology

## 2019-06-13 ENCOUNTER — Telehealth: Payer: Self-pay

## 2019-06-13 ENCOUNTER — Encounter: Payer: Self-pay | Admitting: Gastroenterology

## 2019-06-13 VITALS — BP 118/84 | HR 100 | Temp 98.7°F

## 2019-06-13 DIAGNOSIS — K529 Noninfective gastroenteritis and colitis, unspecified: Secondary | ICD-10-CM

## 2019-06-13 MED ORDER — RIFAXIMIN 550 MG PO TABS: 550.0000 mg | ORAL_TABLET | Freq: Three times a day (TID) | ORAL | 0 refills | Status: AC

## 2019-06-13 NOTE — Patient Instructions (Addendum)
If you are age 80 or older, your body mass index should be between 23-30. Your There is no height or weight on file to calculate BMI. If this is out of the aforementioned range listed, please consider follow up with your Primary Care Provider.    To help prevent the possible spread of infection to our patients, communities, and staff; we will be implementing the following measures:  As of now we are not allowing any visitors/family members to accompany you to any upcoming appointments with [facility]. If you have any concerns about this please contact our office to discuss prior to the appointment.   We are giving you samples of Xifaxan 550mg : Take three times a day for 14 days.  Use Imodium as needed for diarrhea.  Continue Cholestyramine. Increase to twice a day.  Thank you for entrusting me with your care and for choosing [facility], Dr. Denton Cellar

## 2019-06-13 NOTE — Telephone Encounter (Signed)
Patient resides at [facility] facility.  Her primary provider is Oris Drone, NP who is there 4 days a week.  [phone] is the nurse supervisor who can put you in touch with Hinton Dyer. I have left a message to have [facility] call you tomorrow when she is back.

## 2019-06-13 NOTE — Telephone Encounter (Signed)
Liane Comber, pt's nurse at [facility] needs clarification on pt's medication.

## 2019-06-13 NOTE — Telephone Encounter (Signed)
Sheena Adams at  [phone]. LM to call me back.  Called [phone] nurse supervisor and she said to call the [phone]. Note: [phone] is pt's son's cell phone.)

## 2019-06-13 NOTE — Progress Notes (Signed)
HPI :  78 year old female with a history of dementia, fibromyalgia, rheumatoid arthritis, referred by Oris Drone, NP from [facility] for chronic diarrhea.  The patient resides in assisted living at [facility].  She states she has had diarrhea for several years and has been a chronic issue for her.  She states it comes and goes, on average has 3 bowel movements a day, sometimes loose, sometimes not.  She has had only rare incontinence, this does not occur frequently for her.  She denies any blood in her stools.  She denies any nocturnal symptoms.  She has occasional cramps in her mid abdomen which are associated with her bowel movements, and are relieved with her bowel movements.  She does endorse bloating and gas in association with her symptoms.  She states symptoms have been stable over time and not too bothersome recently.  She had a negative C. difficile test in August.  He was previously on leflunomide and it was stopped in case it was related.  She had normal liver enzymes and renal function and electrolytes in July.  At that time her hemoglobin was 12.4, white blood cell count 4.8, MCV 89.  TSH 2.66.   In her chart she is listed to be taking Questran 4 g once a day, probiotic, and listed to receive Imodium as needed a few times a day.  She states she has not been taking any Imodium at all.  She is unclear if she is receiving the Questran, her health aide seems to say she is receiving most of the medicines that are listed.  She does not recall if she has ever had a colonoscopy.  When questioned about her surgical history she cannot recall what surgery she is ever had.  She thinks her gallbladder is in place.  She has oxycodone listed in her chart, she states she does not take this.  We discussed how aggressive she wanted to be in the workup for these symptoms, as outlined below  She is in a wheelchair today, but states she can ambulate and stand on her own.  Past Medical History:  Diagnosis  Date  . Anxiety disorder, unspecified   . Constipation   . Diaphragmatic hernia without obstruction   . Difficulty walking   . Dry eyes   . Encephalopathy, unspecified   . Essential hypertension, benign   . Fibromyalgia   . GERD (gastroesophageal reflux disease)   . Hemorrhoids   . History of falling   . Hypokalemia   . Major depressive disorder with single episode   . Mononeuropathy of lower extremity, left   . Rheumatoid arthritis ([place])   . Vitamin deficiency    Vitamin D     History reviewed. No pertinent surgical history. History reviewed. No pertinent family history. Social History   Tobacco Use  . Smoking status: Never Smoker  . Smokeless tobacco: Never Used  Substance Use Topics  . Alcohol use: Never    Frequency: Never  . Drug use: Never   Current Outpatient Medications  Medication Sig Dispense Refill  . acetaminophen (TYLENOL) 325 MG tablet Take 650 mg by mouth 3 (three) times daily.    . busPIRone (BUSPAR) 10 MG tablet Take 10 mg by mouth 2 (two) times daily.    . carvedilol (COREG) 6.25 MG tablet Take 1 tablet (6.25 mg total) by mouth 2 (two) times daily. 180 tablet 3  . cholecalciferol (VITAMIN D3) 25 MCG (1000 UT) tablet Take 2,000 Units by mouth daily.    Marland Kitchen  cholestyramine (QUESTRAN) 4 GM/DOSE powder Take 4 g by mouth daily.    . cycloSPORINE (RESTASIS) 0.05 % ophthalmic emulsion Place 1 drop into both eyes 2 (two) times daily.    . DULoxetine (CYMBALTA) 60 MG capsule Take 60 mg by mouth daily.    Marland Kitchen guaiFENesin (ROBITUSSIN) 100 MG/5ML liquid Take 10 mLs by mouth every 4 (four) hours as needed for cough.    . hydrocortisone (ANUSOL-HC) 2.5 % rectal cream Place 1 application rectally as needed for hemorrhoids or anal itching.    . hydrocortisone (ANUSOL-HC) 25 MG suppository Place 25 mg rectally as needed for hemorrhoids or anal itching.    . hydroxychloroquine (PLAQUENIL) 200 MG tablet Take 200 mg by mouth daily.    . Influenza Vac A&B Surf Ant Adj (FLUAD IM)  Inject into the muscle.    . loperamide (IMODIUM A-D) 2 MG tablet Take 2 mg by mouth as needed for diarrhea or loose stools. 8 times a day maximum    . miconazole (MICOTIN) 2 % cream Apply 1 application topically 2 (two) times daily.    Marland Kitchen omeprazole (PRILOSEC) 20 MG capsule Take 20 mg by mouth daily.    . ondansetron (ZOFRAN) 4 MG tablet Take 4 mg by mouth every 6 (six) hours as needed for nausea or vomiting.     Marland Kitchen oxyCODONE-acetaminophen (PERCOCET/ROXICET) 5-325 MG tablet Take 1 tablet by mouth every 6 (six) hours as needed for severe pain.     . Probiotic Product (RISA-BID PROBIOTIC PO) Take 250 mg by mouth 2 (two) times daily.    . traZODone (DESYREL) 100 MG tablet Take 100 mg by mouth at bedtime.     No current facility-administered medications for this visit.    Allergies  Allergen Reactions  . Ace Inhibitors Cough  . Biaxin [Clarithromycin] Diarrhea  . Lyrica [Pregabalin] Other (See Comments)    angioedema  . Sulfa Antibiotics Rash     Review of Systems: All systems reviewed and negative except where noted in HPI.     labs per HPI  Physical Exam: BP 118/84 (BP Location: Left Arm, Patient Position: Sitting, Cuff Size: Normal)   Pulse 100   Temp 98.7 F (37.1 C)  Constitutional: Pleasant, female in no acute distress in wheelchair HEENT: Normocephalic and atraumatic. Conjunctivae are normal. No scleral icterus. Neck supple.  Cardiovascular: Normal rate, regular rhythm.  Pulmonary/chest: Effort normal and breath sounds normal. No wheezing, rales or rhonchi. Abdominal: Soft, nondistended, nontender.  There are no masses palpable.  Extremities: no edema Lymphadenopathy: No cervical adenopathy noted. Neurological: Alert and oriented to person place and time. Skin: Skin is warm and dry. No rashes noted. Psychiatric: Normal mood and affect. Behavior is normal.   ASSESSMENT AND PLAN: 78 year old female here for new patient assessment of the following:  Chronic diarrhea -  patient reports chronic diarrhea over several years which has been stable over time.  She does have a component of dementia and is not able to recall answers to certain questions including her last colonoscopy.  She denies any recent worsening of her symptoms, although unclear if that is the truth or not given she was referred here for this issue.  I discussed how aggressive she want to be with this.  She does not appear particularly interested in a colonoscopy as this does not appear to be bothering her too much at this time.  She was interested in trial of medications to see if this will help her quality of life and reduce her  symptoms. I agree with empiric cholestyramine, can increase the dose is twice a day if she tolerates it.  I would stop the probiotic as she states it has not helped, and was able to give her a free sample of rifaximin to try 550 mg 3 a day for 2 weeks given her gas and bloating, suspect she could have IBS.  She can continue Imodium as needed if she is not taking it and stools remain loose.  I will reach out to her referring provider to discuss her case in more detail to determine how she is doing in assisted living and if her symptoms are severe, if colonoscopy should be considered.  I am hoping to get records of her last colonoscopy as well.  We will hold off on any invasive procedures for now until I have more information regarding her case, especially if symptoms are mild and stable.  She agreed  Ileene Patrick, MD [facility]  CC: Valerie Salts, NP

## 2019-06-13 NOTE — Telephone Encounter (Signed)
Called and spoke to another nurse.  Liane Comber was not in.  I let her know the changes Dr.  Havery Moros made today to pt's medications. She expressed understanding and will call us back if there are any other questions.

## 2019-06-14 DIAGNOSIS — G8929 Other chronic pain: Secondary | ICD-10-CM | POA: Diagnosis not present

## 2019-06-14 DIAGNOSIS — E440 Moderate protein-calorie malnutrition: Secondary | ICD-10-CM | POA: Diagnosis not present

## 2019-06-14 DIAGNOSIS — R5382 Chronic fatigue, unspecified: Secondary | ICD-10-CM | POA: Diagnosis not present

## 2019-06-14 DIAGNOSIS — I10 Essential (primary) hypertension: Secondary | ICD-10-CM | POA: Diagnosis not present

## 2019-06-14 DIAGNOSIS — R197 Diarrhea, unspecified: Secondary | ICD-10-CM | POA: Diagnosis not present

## 2019-06-14 DIAGNOSIS — R54 Age-related physical debility: Secondary | ICD-10-CM | POA: Diagnosis not present

## 2019-06-14 NOTE — Telephone Encounter (Signed)
Provider called me from [place]. We discussed the patient's history. I think the patient had minimized some of her symptoms to me during our clinic visit. Provider states she has some good days but also some bad days with fecal incontinence. She had previously declined colonoscopy with them. I had given her a trial of rifaximin and will see if that helps anything, but if persistent loose stools with incontinence will need colonoscopy to rule out microscopic colitis. Provider with speak with patient and her husband to determine if they are interested in that pending her course. If they do wish to proceed they will call back for scheduling.

## 2019-06-20 DIAGNOSIS — R54 Age-related physical debility: Secondary | ICD-10-CM | POA: Diagnosis not present

## 2019-06-20 DIAGNOSIS — S42001D Fracture of unspecified part of right clavicle, subsequent encounter for fracture with routine healing: Secondary | ICD-10-CM | POA: Diagnosis not present

## 2019-06-20 DIAGNOSIS — R600 Localized edema: Secondary | ICD-10-CM | POA: Diagnosis not present

## 2019-06-20 DIAGNOSIS — R21 Rash and other nonspecific skin eruption: Secondary | ICD-10-CM | POA: Diagnosis not present

## 2019-06-20 DIAGNOSIS — R197 Diarrhea, unspecified: Secondary | ICD-10-CM | POA: Diagnosis not present

## 2019-06-20 DIAGNOSIS — M6281 Muscle weakness (generalized): Secondary | ICD-10-CM | POA: Diagnosis not present

## 2019-06-20 DIAGNOSIS — G8929 Other chronic pain: Secondary | ICD-10-CM | POA: Diagnosis not present

## 2019-06-20 DIAGNOSIS — R05 Cough: Secondary | ICD-10-CM | POA: Diagnosis not present

## 2019-06-20 DIAGNOSIS — H04123 Dry eye syndrome of bilateral lacrimal glands: Secondary | ICD-10-CM | POA: Diagnosis not present

## 2019-06-20 DIAGNOSIS — K219 Gastro-esophageal reflux disease without esophagitis: Secondary | ICD-10-CM | POA: Diagnosis not present

## 2019-06-20 DIAGNOSIS — G5792 Unspecified mononeuropathy of left lower limb: Secondary | ICD-10-CM | POA: Diagnosis not present

## 2019-06-20 DIAGNOSIS — E440 Moderate protein-calorie malnutrition: Secondary | ICD-10-CM | POA: Diagnosis not present

## 2019-06-20 DIAGNOSIS — R636 Underweight: Secondary | ICD-10-CM | POA: Diagnosis not present

## 2019-06-20 DIAGNOSIS — R2689 Other abnormalities of gait and mobility: Secondary | ICD-10-CM | POA: Diagnosis not present

## 2019-06-20 DIAGNOSIS — M0680 Other specified rheumatoid arthritis, unspecified site: Secondary | ICD-10-CM | POA: Diagnosis not present

## 2019-06-20 DIAGNOSIS — I10 Essential (primary) hypertension: Secondary | ICD-10-CM | POA: Diagnosis not present

## 2019-06-20 DIAGNOSIS — R5382 Chronic fatigue, unspecified: Secondary | ICD-10-CM | POA: Diagnosis not present

## 2019-06-22 DIAGNOSIS — G5792 Unspecified mononeuropathy of left lower limb: Secondary | ICD-10-CM | POA: Diagnosis not present

## 2019-06-22 DIAGNOSIS — M6281 Muscle weakness (generalized): Secondary | ICD-10-CM | POA: Diagnosis not present

## 2019-06-22 DIAGNOSIS — S42001D Fracture of unspecified part of right clavicle, subsequent encounter for fracture with routine healing: Secondary | ICD-10-CM | POA: Diagnosis not present

## 2019-06-22 DIAGNOSIS — R2689 Other abnormalities of gait and mobility: Secondary | ICD-10-CM | POA: Diagnosis not present

## 2019-06-26 DIAGNOSIS — Z03818 Encounter for observation for suspected exposure to other biological agents ruled out: Secondary | ICD-10-CM | POA: Diagnosis not present

## 2019-07-04 DIAGNOSIS — H119 Unspecified disorder of conjunctiva: Secondary | ICD-10-CM | POA: Diagnosis not present

## 2019-07-07 DIAGNOSIS — Z03818 Encounter for observation for suspected exposure to other biological agents ruled out: Secondary | ICD-10-CM | POA: Diagnosis not present

## 2019-07-11 DIAGNOSIS — R05 Cough: Secondary | ICD-10-CM | POA: Diagnosis not present

## 2019-07-15 DIAGNOSIS — Z03818 Encounter for observation for suspected exposure to other biological agents ruled out: Secondary | ICD-10-CM | POA: Diagnosis not present

## 2019-07-18 DIAGNOSIS — Z79899 Other long term (current) drug therapy: Secondary | ICD-10-CM | POA: Diagnosis not present

## 2019-07-18 DIAGNOSIS — K5730 Diverticulosis of large intestine without perforation or abscess without bleeding: Secondary | ICD-10-CM | POA: Diagnosis not present

## 2019-07-18 DIAGNOSIS — I509 Heart failure, unspecified: Secondary | ICD-10-CM | POA: Diagnosis not present

## 2019-07-18 DIAGNOSIS — R319 Hematuria, unspecified: Secondary | ICD-10-CM | POA: Diagnosis not present

## 2019-07-18 DIAGNOSIS — N390 Urinary tract infection, site not specified: Secondary | ICD-10-CM | POA: Diagnosis not present

## 2019-07-18 DIAGNOSIS — I10 Essential (primary) hypertension: Secondary | ICD-10-CM | POA: Diagnosis not present

## 2019-07-20 DIAGNOSIS — Z7952 Long term (current) use of systemic steroids: Secondary | ICD-10-CM | POA: Diagnosis not present

## 2019-07-23 DIAGNOSIS — R54 Age-related physical debility: Secondary | ICD-10-CM | POA: Diagnosis not present

## 2019-07-23 DIAGNOSIS — R5382 Chronic fatigue, unspecified: Secondary | ICD-10-CM | POA: Diagnosis not present

## 2019-07-23 DIAGNOSIS — R636 Underweight: Secondary | ICD-10-CM | POA: Diagnosis not present

## 2019-07-23 DIAGNOSIS — M0680 Other specified rheumatoid arthritis, unspecified site: Secondary | ICD-10-CM | POA: Diagnosis not present

## 2019-07-23 DIAGNOSIS — E440 Moderate protein-calorie malnutrition: Secondary | ICD-10-CM | POA: Diagnosis not present

## 2019-07-23 DIAGNOSIS — R600 Localized edema: Secondary | ICD-10-CM | POA: Diagnosis not present

## 2019-07-23 DIAGNOSIS — H04123 Dry eye syndrome of bilateral lacrimal glands: Secondary | ICD-10-CM | POA: Diagnosis not present

## 2019-07-23 DIAGNOSIS — I10 Essential (primary) hypertension: Secondary | ICD-10-CM | POA: Diagnosis not present

## 2019-07-23 DIAGNOSIS — R197 Diarrhea, unspecified: Secondary | ICD-10-CM | POA: Diagnosis not present

## 2019-07-23 DIAGNOSIS — R21 Rash and other nonspecific skin eruption: Secondary | ICD-10-CM | POA: Diagnosis not present

## 2019-07-23 DIAGNOSIS — G8929 Other chronic pain: Secondary | ICD-10-CM | POA: Diagnosis not present

## 2019-07-23 DIAGNOSIS — K219 Gastro-esophageal reflux disease without esophagitis: Secondary | ICD-10-CM | POA: Diagnosis not present

## 2019-07-24 DIAGNOSIS — N390 Urinary tract infection, site not specified: Secondary | ICD-10-CM | POA: Diagnosis not present

## 2019-07-24 DIAGNOSIS — Z79899 Other long term (current) drug therapy: Secondary | ICD-10-CM | POA: Diagnosis not present

## 2019-07-24 DIAGNOSIS — R319 Hematuria, unspecified: Secondary | ICD-10-CM | POA: Diagnosis not present

## 2019-07-25 DIAGNOSIS — Z03818 Encounter for observation for suspected exposure to other biological agents ruled out: Secondary | ICD-10-CM | POA: Diagnosis not present

## 2019-08-15 DIAGNOSIS — Z1159 Encounter for screening for other viral diseases: Secondary | ICD-10-CM | POA: Diagnosis not present

## 2019-08-20 DIAGNOSIS — Z03818 Encounter for observation for suspected exposure to other biological agents ruled out: Secondary | ICD-10-CM | POA: Diagnosis not present

## 2019-08-31 DIAGNOSIS — I2510 Atherosclerotic heart disease of native coronary artery without angina pectoris: Secondary | ICD-10-CM | POA: Diagnosis not present

## 2019-08-31 DIAGNOSIS — D649 Anemia, unspecified: Secondary | ICD-10-CM | POA: Diagnosis not present

## 2019-08-31 DIAGNOSIS — U071 COVID-19: Secondary | ICD-10-CM | POA: Diagnosis not present

## 2019-08-31 DIAGNOSIS — R05 Cough: Secondary | ICD-10-CM | POA: Diagnosis not present

## 2019-08-31 DIAGNOSIS — E7800 Pure hypercholesterolemia, unspecified: Secondary | ICD-10-CM | POA: Diagnosis not present

## 2019-09-05 ENCOUNTER — Emergency Department (HOSPITAL_COMMUNITY)
Admission: EM | Admit: 2019-09-05 | Discharge: 2019-09-05 | Disposition: A | Discharge status: Home / Self Care | Payer: Medicare Other | Attending: Emergency Medicine | Admitting: Emergency Medicine

## 2019-09-05 ENCOUNTER — Encounter (HOSPITAL_COMMUNITY): Payer: Self-pay

## 2019-09-05 ENCOUNTER — Other Ambulatory Visit: Payer: Self-pay

## 2019-09-05 ENCOUNTER — Emergency Department (HOSPITAL_COMMUNITY): Payer: Medicare Other

## 2019-09-05 DIAGNOSIS — E860 Dehydration: Secondary | ICD-10-CM | POA: Insufficient documentation

## 2019-09-05 DIAGNOSIS — M2550 Pain in unspecified joint: Secondary | ICD-10-CM | POA: Diagnosis not present

## 2019-09-05 DIAGNOSIS — Z7401 Bed confinement status: Secondary | ICD-10-CM | POA: Diagnosis not present

## 2019-09-05 DIAGNOSIS — R0602 Shortness of breath: Secondary | ICD-10-CM | POA: Diagnosis not present

## 2019-09-05 DIAGNOSIS — R000 Tachycardia, unspecified: Secondary | ICD-10-CM | POA: Diagnosis not present

## 2019-09-05 DIAGNOSIS — R231 Pallor: Secondary | ICD-10-CM | POA: Diagnosis not present

## 2019-09-05 DIAGNOSIS — U071 COVID-19: Secondary | ICD-10-CM | POA: Insufficient documentation

## 2019-09-05 DIAGNOSIS — R0689 Other abnormalities of breathing: Secondary | ICD-10-CM | POA: Diagnosis not present

## 2019-09-05 DIAGNOSIS — I10 Essential (primary) hypertension: Secondary | ICD-10-CM | POA: Diagnosis not present

## 2019-09-05 DIAGNOSIS — R0682 Tachypnea, not elsewhere classified: Secondary | ICD-10-CM | POA: Diagnosis not present

## 2019-09-05 DIAGNOSIS — Z79899 Other long term (current) drug therapy: Secondary | ICD-10-CM | POA: Insufficient documentation

## 2019-09-05 DIAGNOSIS — R5381 Other malaise: Secondary | ICD-10-CM | POA: Diagnosis not present

## 2019-09-05 DIAGNOSIS — R404 Transient alteration of awareness: Secondary | ICD-10-CM | POA: Diagnosis not present

## 2019-09-05 LAB — I-STAT CHEM 8, ED
BUN: 12 mg/dL (ref 8–23)
Calcium, Ion: 1.13 mmol/L — ABNORMAL LOW (ref 1.15–1.40)
Chloride: 109 mmol/L (ref 98–111)
Creatinine, Ser: 0.6 mg/dL (ref 0.44–1.00)
Glucose, Bld: 84 mg/dL (ref 70–99)
HCT: 35 % — ABNORMAL LOW (ref 36.0–46.0)
Hemoglobin: 11.9 g/dL — ABNORMAL LOW (ref 12.0–15.0)
Potassium: 3.5 mmol/L (ref 3.5–5.1)
Sodium: 142 mmol/L (ref 135–145)
TCO2: 23 mmol/L (ref 22–32)

## 2019-09-05 LAB — CBC WITH DIFFERENTIAL/PLATELET
Abs Immature Granulocytes: 0.03 10*3/uL (ref 0.00–0.07)
Basophils Absolute: 0 10*3/uL (ref 0.0–0.1)
Basophils Relative: 0 %
Eosinophils Absolute: 0.1 10*3/uL (ref 0.0–0.5)
Eosinophils Relative: 1 %
HCT: 38.7 % (ref 36.0–46.0)
Hemoglobin: 12.1 g/dL (ref 12.0–15.0)
Immature Granulocytes: 1 %
Lymphocytes Relative: 30 %
Lymphs Abs: 1.7 10*3/uL (ref 0.7–4.0)
MCH: 27.6 pg (ref 26.0–34.0)
MCHC: 31.3 g/dL (ref 30.0–36.0)
MCV: 88.4 fL (ref 80.0–100.0)
Monocytes Absolute: 0.6 10*3/uL (ref 0.1–1.0)
Monocytes Relative: 10 %
Neutro Abs: 3.3 10*3/uL (ref 1.7–7.7)
Neutrophils Relative %: 58 %
Platelets: 282 10*3/uL (ref 150–400)
RBC: 4.38 MIL/uL (ref 3.87–5.11)
RDW: 13 % (ref 11.5–15.5)
WBC: 5.7 10*3/uL (ref 4.0–10.5)
nRBC: 0 % (ref 0.0–0.2)

## 2019-09-05 LAB — BASIC METABOLIC PANEL
Anion gap: 4 — ABNORMAL LOW (ref 5–15)
BUN: 12 mg/dL (ref 8–23)
CO2: 19 mmol/L — ABNORMAL LOW (ref 22–32)
Calcium: 6.4 mg/dL — CL (ref 8.9–10.3)
Chloride: 120 mmol/L — ABNORMAL HIGH (ref 98–111)
Creatinine, Ser: 0.41 mg/dL — ABNORMAL LOW (ref 0.44–1.00)
GFR calc Af Amer: >60 mL/min (ref >60)
GFR calc non Af Amer: >60 mL/min (ref >60)
Glucose, Bld: 76 mg/dL (ref 70–99)
Potassium: 2.8 mmol/L — ABNORMAL LOW (ref 3.5–5.1)
Sodium: 143 mmol/L (ref 135–145)

## 2019-09-05 MED ORDER — SODIUM CHLORIDE 0.9 % IV BOLUS: 1000.0000 mL | Freq: Once | INTRAVENOUS | Status: DC

## 2019-09-05 MED ORDER — SODIUM CHLORIDE 0.9 % IV BOLUS
1000.0000 mL | Freq: Once | INTRAVENOUS | Status: AC
  Administered 2019-09-05: 16:00:00 1000 mL via INTRAVENOUS

## 2019-09-05 NOTE — ED Notes (Signed)
[facility] called  

## 2019-09-05 NOTE — Discharge Instructions (Signed)
Monitor your condition carefully, stay well-hydrated, and do not hesitate to return if you develop new concerning changes.  Otherwise please follow-up with your physician.

## 2019-09-05 NOTE — ED Notes (Signed)
Attempted report again to facility, no answer.

## 2019-09-05 NOTE — ED Provider Notes (Signed)
[facility]-EMERGENCY DEPT Provider Note   CSN: [id number] Arrival date & time: 09/05/19  1458     History Chief Complaint  Patient presents with  . COVID Positive  . Shortness of Breath    [place] S Kizer is a 79 y.o. female.  HPI     Patient presents from nursing facility with staff concerns of respiratory distress. Reportedly the patient is Covid positive, though it is unknown known when the patient's test results actually occurred. Reportedly the patient had an episode of increased work of breathing, prompting EMS notification. EMS personnel note that on their arrival the patient was sitting upright, using the telephone. She did have tachycardia, but no obvious increased work of breathing.  There was no hypoxia in route, and she was on room air. Patient self denies pain, denies difficulty breathing, denies nausea, denies fever.  She does have a history of dementia, reportedly mild, but seems to answer questions regarding her particular presentation appropriately.  Past Medical History:  Diagnosis Date  . Anxiety disorder, unspecified   . Constipation   . Diaphragmatic hernia without obstruction   . Difficulty walking   . Dry eyes   . Encephalopathy, unspecified   . Essential hypertension, benign   . Fibromyalgia   . GERD (gastroesophageal reflux disease)   . Hemorrhoids   . History of falling   . Hypokalemia   . Major depressive disorder with single episode   . Mononeuropathy of lower extremity, left   . Rheumatoid arthritis (HCC)   . Vitamin deficiency    Vitamin D    There are no problems to display for this patient.   History reviewed. No pertinent surgical history.   OB History   No obstetric history on file.     History reviewed. No pertinent family history.  Social History   Tobacco Use  . Smoking status: Never Smoker  . Smokeless tobacco: Never Used  Substance Use Topics  . Alcohol use: Never  . Drug use: Never    Home Medications Prior to Admission medications   Medication Sig Start Date End Date Taking? Authorizing Provider  acetaminophen (TYLENOL) 325 MG tablet Take 650 mg by mouth 3 (three) times daily.    [provider]  busPIRone (BUSPAR) 10 MG tablet Take 10 mg by mouth 2 (two) times daily.    [provider]  carvedilol (COREG) 6.25 MG tablet Take 1 tablet (6.25 mg total) by mouth 2 (two) times daily. 07/10/18   Jake Bathe, MD  cholecalciferol (VITAMIN D3) 25 MCG (1000 UT) tablet Take 2,000 Units by mouth daily.    [provider]  cholestyramine Lanetta Inch) 4 GM/DOSE powder Take 4 g by mouth 2 (two) times daily.    [provider]  cycloSPORINE (RESTASIS) 0.05 % ophthalmic emulsion Place 1 drop into both eyes 2 (two) times daily.    [provider]  DULoxetine (CYMBALTA) 60 MG capsule Take 60 mg by mouth daily.    [provider]  guaiFENesin (ROBITUSSIN) 100 MG/5ML liquid Take 10 mLs by mouth every 4 (four) hours as needed for cough.    [provider]  hydrocortisone (ANUSOL-HC) 2.5 % rectal cream Place 1 application rectally as needed for hemorrhoids or anal itching.    [provider]  hydrocortisone (ANUSOL-HC) 25 MG suppository Place 25 mg rectally as needed for hemorrhoids or anal itching.    [provider]  hydroxychloroquine (PLAQUENIL) 200 MG tablet Take 200 mg by mouth daily.  [provider]  Influenza Vac A&B Surf Ant Adj (FLUAD IM) Inject into the muscle.    [provider]  loperamide (IMODIUM A-D) 2 MG tablet Take 2 mg by mouth as needed for diarrhea or loose stools. 8 times a day maximum    [provider]  miconazole (MICOTIN) 2 % cream Apply 1 application topically 2 (two) times daily.    [provider]  omeprazole (PRILOSEC) 20 MG capsule Take 20 mg by mouth daily.    [provider]  ondansetron (ZOFRAN) 4 MG tablet Take 4 mg by mouth every 6  (six) hours as needed for nausea or vomiting.     [provider]  oxyCODONE-acetaminophen (PERCOCET/ROXICET) 5-325 MG tablet Take 1 tablet by mouth every 6 (six) hours as needed for severe pain.     [provider]  rifaximin (XIFAXAN) 550 MG TABS tablet Take 1 tablet (550 mg total) by mouth 3 (three) times daily. Lot #: [id number]   Exp: 10-2023 Lot#: [id number]  Exp: 50-3546 06/13/19   Armbruster, Willaim Rayas, MD  traZODone (DESYREL) 100 MG tablet Take 100 mg by mouth at bedtime.    [provider]    Allergies    Ace inhibitors, Biaxin [clarithromycin], Lyrica [pregabalin], and Sulfa antibiotics  Review of Systems   Review of Systems  Constitutional:       Per HPI, otherwise negative  HENT:       Per HPI, otherwise negative  Respiratory:       Per HPI, otherwise negative  Cardiovascular:       Per HPI, otherwise negative  Gastrointestinal: Negative for vomiting.  Endocrine:       Negative aside from HPI  Genitourinary:       Neg aside from HPI   Musculoskeletal:       Per HPI, otherwise negative  Skin: Negative.   Neurological: Negative for syncope.  Psychiatric/Behavioral:       Patient reportedly has mild dementia, but describes today's events seemingly appropriately.    Physical Exam Updated Vital Signs BP (!) 129/92   Pulse 95   Temp 98.4 F (36.9 C) (Oral)   Resp (!) 23   SpO2 96%   Physical Exam Vitals and nursing note reviewed.  Constitutional:      General: She is not in acute distress.    Appearance: She is well-developed.     Comments: Cachectic elderly female awake and alert sitting upright speaking clearly with no supplemental oxygen  HENT:     Head: Normocephalic and atraumatic.  Eyes:     Conjunctiva/sclera: Conjunctivae normal.  Cardiovascular:     Rate and Rhythm: Normal rate and regular rhythm.  Pulmonary:     Effort: Pulmonary effort is normal. No respiratory distress.     Breath sounds: Normal breath sounds. No stridor.   Abdominal:     General: There is no distension.  Skin:    General: Skin is warm and dry.  Neurological:     General: No focal deficit present.     Mental Status: She is alert.     Cranial Nerves: No cranial nerve deficit.     Motor: Atrophy present.     Comments: Patient oriented to self, place.     ED Results / Procedures / Treatments   Labs (all labs ordered are listed, but only abnormal results are displayed) Labs Reviewed  BASIC METABOLIC PANEL - Abnormal; Notable for the following components:      Result Value  Potassium 2.8 (*)    Chloride 120 (*)    CO2 19 (*)    Creatinine, Ser 0.41 (*)    Calcium 6.4 (*)    Anion gap 4 (*)    All other components within normal limits  I-STAT CHEM 8, ED - Abnormal; Notable for the following components:   Calcium, Ion 1.13 (*)    Hemoglobin 11.9 (*)    HCT 35.0 (*)    All other components within normal limits  CBC WITH DIFFERENTIAL/PLATELET    EKG EKG Interpretation  Date/Time:  Wednesday September 05 2019 15:23:58 EST Ventricular Rate:  113 PR Interval:    QRS Duration: 104 QT Interval:  348 QTC Calculation: 478 R Axis:   -88 Text Interpretation: Sinus tachycardia Atrial premature complex Left anterior fascicular block Anterior infarct, old Baseline wander Artifact Abnormal ECG Confirmed by Carmin Muskrat [phone]) on 09/05/2019 3:36:27 PM   Radiology DG Chest Port 1 View  Result Date: 09/05/2019 CLINICAL DATA:  Shortness of breath, COVID positive. EXAM: PORTABLE CHEST 1 VIEW COMPARISON:  None. FINDINGS: Patient is rotated. Heart is enlarged. Thoracic aorta is calcified. Probable focal scarring in the lingula. Lungs are otherwise clear. No pleural fluid. IMPRESSION: No acute findings. Electronically Signed   By: Lorin Picket M.D.   On: 09/05/2019 16:17    Procedures Procedures (including critical care time)  Medications Ordered in ED Medications  sodium chloride 0.9 % bolus 1,000 mL (1,000 mLs Intravenous New  Bag/Given (Non-Interop) 09/05/19 1537)    ED Course  I have reviewed the triage vital signs and the nursing notes.  Pertinent labs & imaging results that were available during my care of the patient were reviewed by me and considered in my medical decision making (see chart for details). Initial labs notable for hypercalcemia.   MDM Rules/Calculators/A&P                      5:01 PM She has received fluid resuscitation, repeat labs generally reassuring, resolution of her tachycardia has occurred, she continues to deny complaints, has no increased work of breathing, no oxygen requirement. Patient does have some mild lab abnormalities consistent with ongoing infection, but no evidence for bacteremia, sepsis, no substantial inflight abnormalities, no evidence for distress or increased work of breathing. Patient appropriate for discharge with return to her nursing facility.  [place] S [place] was evaluated in Emergency Department on 09/05/2019 for the symptoms described in the history of present illness. She was evaluated in the context of the global COVID-19 pandemic, which necessitated consideration that the patient might be at risk for infection with the SARS-CoV-2 virus that causes COVID-19. Institutional protocols and algorithms that pertain to the evaluation of patients at risk for COVID-19 are in a state of rapid change based on information released by regulatory bodies including the CDC and federal and state organizations. These policies and algorithms were followed during the patient's care in the ED.  Final Clinical Impression(s) / ED Diagnoses Final diagnoses:  COVID-19 virus infection  Dehydration     Carmin Muskrat, MD 09/05/19 [phone]

## 2019-09-05 NOTE — ED Triage Notes (Signed)
Pt BIB EMS from [facility]. Pt is COVID positive as of a week ago. Pt has been asymptomatic until today. Pt now has mild cough. Facility causes out de to respiratory distress, when EMS arrived pt was talking on the phone with son. Pt hx of dementia.   RR 28 HR 105-130 BP 154/95 SpO2 97% RA CBG 117

## 2019-09-05 NOTE — ED Notes (Signed)
Attempted to call report for facility, left voicemail as no answer.

## 2019-09-07 DIAGNOSIS — D649 Anemia, unspecified: Secondary | ICD-10-CM | POA: Diagnosis not present

## 2019-09-07 DIAGNOSIS — U071 COVID-19: Secondary | ICD-10-CM | POA: Diagnosis not present

## 2019-09-10 DIAGNOSIS — D649 Anemia, unspecified: Secondary | ICD-10-CM | POA: Diagnosis not present

## 2019-09-10 DIAGNOSIS — R000 Tachycardia, unspecified: Secondary | ICD-10-CM | POA: Diagnosis not present

## 2019-09-10 DIAGNOSIS — U071 COVID-19: Secondary | ICD-10-CM | POA: Diagnosis not present

## 2019-09-11 DIAGNOSIS — E039 Hypothyroidism, unspecified: Secondary | ICD-10-CM | POA: Diagnosis not present

## 2019-09-11 DIAGNOSIS — R000 Tachycardia, unspecified: Secondary | ICD-10-CM | POA: Diagnosis not present

## 2019-09-11 DIAGNOSIS — D649 Anemia, unspecified: Secondary | ICD-10-CM | POA: Diagnosis not present

## 2019-09-12 DIAGNOSIS — M85861 Other specified disorders of bone density and structure, right lower leg: Secondary | ICD-10-CM | POA: Diagnosis not present

## 2019-09-12 DIAGNOSIS — M1711 Unilateral primary osteoarthritis, right knee: Secondary | ICD-10-CM | POA: Diagnosis not present

## 2019-09-14 DIAGNOSIS — G8929 Other chronic pain: Secondary | ICD-10-CM | POA: Diagnosis not present

## 2019-09-14 DIAGNOSIS — M85861 Other specified disorders of bone density and structure, right lower leg: Secondary | ICD-10-CM | POA: Diagnosis not present

## 2019-09-19 DIAGNOSIS — I10 Essential (primary) hypertension: Secondary | ICD-10-CM | POA: Diagnosis not present

## 2019-09-24 DIAGNOSIS — M79604 Pain in right leg: Secondary | ICD-10-CM | POA: Diagnosis not present

## 2019-09-25 DIAGNOSIS — R197 Diarrhea, unspecified: Secondary | ICD-10-CM | POA: Diagnosis not present

## 2019-09-25 DIAGNOSIS — E440 Moderate protein-calorie malnutrition: Secondary | ICD-10-CM | POA: Diagnosis not present

## 2019-10-09 DIAGNOSIS — R5383 Other fatigue: Secondary | ICD-10-CM | POA: Diagnosis not present

## 2019-10-09 DIAGNOSIS — R197 Diarrhea, unspecified: Secondary | ICD-10-CM | POA: Diagnosis not present

## 2019-10-09 DIAGNOSIS — R54 Age-related physical debility: Secondary | ICD-10-CM | POA: Diagnosis not present

## 2019-10-09 DIAGNOSIS — G8929 Other chronic pain: Secondary | ICD-10-CM | POA: Diagnosis not present

## 2019-10-09 DIAGNOSIS — K219 Gastro-esophageal reflux disease without esophagitis: Secondary | ICD-10-CM | POA: Diagnosis not present

## 2019-10-09 DIAGNOSIS — E440 Moderate protein-calorie malnutrition: Secondary | ICD-10-CM | POA: Diagnosis not present

## 2019-10-10 DIAGNOSIS — F3289 Other specified depressive episodes: Secondary | ICD-10-CM | POA: Diagnosis not present

## 2019-10-10 DIAGNOSIS — H04123 Dry eye syndrome of bilateral lacrimal glands: Secondary | ICD-10-CM | POA: Diagnosis not present

## 2019-10-10 DIAGNOSIS — M059 Rheumatoid arthritis with rheumatoid factor, unspecified: Secondary | ICD-10-CM | POA: Diagnosis not present

## 2019-10-10 DIAGNOSIS — G894 Chronic pain syndrome: Secondary | ICD-10-CM | POA: Diagnosis not present

## 2019-10-10 DIAGNOSIS — R197 Diarrhea, unspecified: Secondary | ICD-10-CM | POA: Diagnosis not present

## 2019-10-10 DIAGNOSIS — E559 Vitamin D deficiency, unspecified: Secondary | ICD-10-CM | POA: Diagnosis not present

## 2019-10-10 DIAGNOSIS — K648 Other hemorrhoids: Secondary | ICD-10-CM | POA: Diagnosis not present

## 2019-10-10 DIAGNOSIS — R54 Age-related physical debility: Secondary | ICD-10-CM | POA: Diagnosis not present

## 2019-10-10 DIAGNOSIS — F419 Anxiety disorder, unspecified: Secondary | ICD-10-CM | POA: Diagnosis not present

## 2019-10-10 DIAGNOSIS — F0390 Unspecified dementia without behavioral disturbance: Secondary | ICD-10-CM | POA: Diagnosis not present

## 2019-10-10 DIAGNOSIS — K219 Gastro-esophageal reflux disease without esophagitis: Secondary | ICD-10-CM | POA: Diagnosis not present

## 2019-10-15 DIAGNOSIS — G894 Chronic pain syndrome: Secondary | ICD-10-CM | POA: Diagnosis not present

## 2019-10-18 DIAGNOSIS — M0579 Rheumatoid arthritis with rheumatoid factor of multiple sites without organ or systems involvement: Secondary | ICD-10-CM | POA: Diagnosis not present

## 2019-10-18 DIAGNOSIS — M154 Erosive (osteo)arthritis: Secondary | ICD-10-CM | POA: Diagnosis not present

## 2019-10-18 DIAGNOSIS — M150 Primary generalized (osteo)arthritis: Secondary | ICD-10-CM | POA: Diagnosis not present

## 2019-10-18 DIAGNOSIS — Z79899 Other long term (current) drug therapy: Secondary | ICD-10-CM | POA: Diagnosis not present

## 2019-10-22 DIAGNOSIS — H579 Unspecified disorder of eye and adnexa: Secondary | ICD-10-CM | POA: Diagnosis not present

## 2019-10-22 DIAGNOSIS — Z7952 Long term (current) use of systemic steroids: Secondary | ICD-10-CM | POA: Diagnosis not present

## 2019-10-24 DIAGNOSIS — G894 Chronic pain syndrome: Secondary | ICD-10-CM | POA: Diagnosis not present

## 2019-10-26 DIAGNOSIS — M6281 Muscle weakness (generalized): Secondary | ICD-10-CM | POA: Diagnosis not present

## 2019-10-26 DIAGNOSIS — M0680 Other specified rheumatoid arthritis, unspecified site: Secondary | ICD-10-CM | POA: Diagnosis not present

## 2019-10-27 DIAGNOSIS — N390 Urinary tract infection, site not specified: Secondary | ICD-10-CM | POA: Diagnosis not present

## 2019-10-27 DIAGNOSIS — R319 Hematuria, unspecified: Secondary | ICD-10-CM | POA: Diagnosis not present

## 2019-10-29 DIAGNOSIS — N390 Urinary tract infection, site not specified: Secondary | ICD-10-CM | POA: Diagnosis not present

## 2019-10-30 DIAGNOSIS — M0680 Other specified rheumatoid arthritis, unspecified site: Secondary | ICD-10-CM | POA: Diagnosis not present

## 2019-10-30 DIAGNOSIS — M6281 Muscle weakness (generalized): Secondary | ICD-10-CM | POA: Diagnosis not present

## 2019-11-06 DIAGNOSIS — M6281 Muscle weakness (generalized): Secondary | ICD-10-CM | POA: Diagnosis not present

## 2019-11-06 DIAGNOSIS — M0680 Other specified rheumatoid arthritis, unspecified site: Secondary | ICD-10-CM | POA: Diagnosis not present

## 2019-11-07 DIAGNOSIS — R000 Tachycardia, unspecified: Secondary | ICD-10-CM | POA: Diagnosis not present

## 2019-11-07 DIAGNOSIS — H04123 Dry eye syndrome of bilateral lacrimal glands: Secondary | ICD-10-CM | POA: Diagnosis not present

## 2019-11-07 DIAGNOSIS — E559 Vitamin D deficiency, unspecified: Secondary | ICD-10-CM | POA: Diagnosis not present

## 2019-11-07 DIAGNOSIS — K648 Other hemorrhoids: Secondary | ICD-10-CM | POA: Diagnosis not present

## 2019-11-07 DIAGNOSIS — K219 Gastro-esophageal reflux disease without esophagitis: Secondary | ICD-10-CM | POA: Diagnosis not present

## 2019-11-07 DIAGNOSIS — G894 Chronic pain syndrome: Secondary | ICD-10-CM | POA: Diagnosis not present

## 2019-11-07 DIAGNOSIS — F0390 Unspecified dementia without behavioral disturbance: Secondary | ICD-10-CM | POA: Diagnosis not present

## 2019-11-07 DIAGNOSIS — R54 Age-related physical debility: Secondary | ICD-10-CM | POA: Diagnosis not present

## 2019-11-07 DIAGNOSIS — D649 Anemia, unspecified: Secondary | ICD-10-CM | POA: Diagnosis not present

## 2019-11-07 DIAGNOSIS — Z79899 Other long term (current) drug therapy: Secondary | ICD-10-CM | POA: Diagnosis not present

## 2019-11-07 DIAGNOSIS — R197 Diarrhea, unspecified: Secondary | ICD-10-CM | POA: Diagnosis not present

## 2019-11-07 DIAGNOSIS — H579 Unspecified disorder of eye and adnexa: Secondary | ICD-10-CM | POA: Diagnosis not present

## 2019-11-07 DIAGNOSIS — M059 Rheumatoid arthritis with rheumatoid factor, unspecified: Secondary | ICD-10-CM | POA: Diagnosis not present

## 2019-11-07 DIAGNOSIS — F3289 Other specified depressive episodes: Secondary | ICD-10-CM | POA: Diagnosis not present

## 2019-11-07 DIAGNOSIS — F419 Anxiety disorder, unspecified: Secondary | ICD-10-CM | POA: Diagnosis not present

## 2019-11-08 DIAGNOSIS — M6281 Muscle weakness (generalized): Secondary | ICD-10-CM | POA: Diagnosis not present

## 2019-11-08 DIAGNOSIS — M0680 Other specified rheumatoid arthritis, unspecified site: Secondary | ICD-10-CM | POA: Diagnosis not present

## 2019-11-15 DIAGNOSIS — M0680 Other specified rheumatoid arthritis, unspecified site: Secondary | ICD-10-CM | POA: Diagnosis not present

## 2019-11-15 DIAGNOSIS — M6281 Muscle weakness (generalized): Secondary | ICD-10-CM | POA: Diagnosis not present

## 2019-11-19 DIAGNOSIS — M0680 Other specified rheumatoid arthritis, unspecified site: Secondary | ICD-10-CM | POA: Diagnosis not present

## 2019-11-19 DIAGNOSIS — G894 Chronic pain syndrome: Secondary | ICD-10-CM | POA: Diagnosis not present

## 2019-11-19 DIAGNOSIS — M6281 Muscle weakness (generalized): Secondary | ICD-10-CM | POA: Diagnosis not present

## 2019-11-20 DIAGNOSIS — M0680 Other specified rheumatoid arthritis, unspecified site: Secondary | ICD-10-CM | POA: Diagnosis not present

## 2019-11-20 DIAGNOSIS — M6281 Muscle weakness (generalized): Secondary | ICD-10-CM | POA: Diagnosis not present

## 2019-11-22 DIAGNOSIS — M24571 Contracture, right ankle: Secondary | ICD-10-CM | POA: Diagnosis not present

## 2019-11-22 DIAGNOSIS — M0680 Other specified rheumatoid arthritis, unspecified site: Secondary | ICD-10-CM | POA: Diagnosis not present

## 2019-12-06 DIAGNOSIS — E559 Vitamin D deficiency, unspecified: Secondary | ICD-10-CM | POA: Diagnosis not present

## 2019-12-06 DIAGNOSIS — R197 Diarrhea, unspecified: Secondary | ICD-10-CM | POA: Diagnosis not present

## 2019-12-06 DIAGNOSIS — K219 Gastro-esophageal reflux disease without esophagitis: Secondary | ICD-10-CM | POA: Diagnosis not present

## 2019-12-06 DIAGNOSIS — G894 Chronic pain syndrome: Secondary | ICD-10-CM | POA: Diagnosis not present

## 2019-12-06 DIAGNOSIS — R54 Age-related physical debility: Secondary | ICD-10-CM | POA: Diagnosis not present

## 2019-12-17 DIAGNOSIS — R54 Age-related physical debility: Secondary | ICD-10-CM | POA: Diagnosis not present

## 2019-12-17 DIAGNOSIS — F419 Anxiety disorder, unspecified: Secondary | ICD-10-CM | POA: Diagnosis not present

## 2019-12-17 DIAGNOSIS — F0390 Unspecified dementia without behavioral disturbance: Secondary | ICD-10-CM | POA: Diagnosis not present

## 2019-12-17 DIAGNOSIS — I10 Essential (primary) hypertension: Secondary | ICD-10-CM | POA: Diagnosis not present

## 2019-12-17 DIAGNOSIS — H04123 Dry eye syndrome of bilateral lacrimal glands: Secondary | ICD-10-CM | POA: Diagnosis not present

## 2019-12-17 DIAGNOSIS — F3289 Other specified depressive episodes: Secondary | ICD-10-CM | POA: Diagnosis not present

## 2019-12-17 DIAGNOSIS — G894 Chronic pain syndrome: Secondary | ICD-10-CM | POA: Diagnosis not present

## 2019-12-17 DIAGNOSIS — E440 Moderate protein-calorie malnutrition: Secondary | ICD-10-CM | POA: Diagnosis not present

## 2019-12-17 DIAGNOSIS — M059 Rheumatoid arthritis with rheumatoid factor, unspecified: Secondary | ICD-10-CM | POA: Diagnosis not present

## 2019-12-17 DIAGNOSIS — E559 Vitamin D deficiency, unspecified: Secondary | ICD-10-CM | POA: Diagnosis not present

## 2019-12-17 DIAGNOSIS — K219 Gastro-esophageal reflux disease without esophagitis: Secondary | ICD-10-CM | POA: Diagnosis not present

## 2019-12-17 DIAGNOSIS — R197 Diarrhea, unspecified: Secondary | ICD-10-CM | POA: Diagnosis not present

## 2020-01-09 DIAGNOSIS — R300 Dysuria: Secondary | ICD-10-CM | POA: Diagnosis not present

## 2020-01-09 DIAGNOSIS — R82998 Other abnormal findings in urine: Secondary | ICD-10-CM | POA: Diagnosis not present

## 2020-01-14 DIAGNOSIS — N390 Urinary tract infection, site not specified: Secondary | ICD-10-CM | POA: Diagnosis not present

## 2020-01-14 DIAGNOSIS — R300 Dysuria: Secondary | ICD-10-CM | POA: Diagnosis not present

## 2020-01-14 DIAGNOSIS — R319 Hematuria, unspecified: Secondary | ICD-10-CM | POA: Diagnosis not present

## 2020-01-16 DIAGNOSIS — F0390 Unspecified dementia without behavioral disturbance: Secondary | ICD-10-CM | POA: Diagnosis not present

## 2020-01-16 DIAGNOSIS — R54 Age-related physical debility: Secondary | ICD-10-CM | POA: Diagnosis not present

## 2020-01-16 DIAGNOSIS — K219 Gastro-esophageal reflux disease without esophagitis: Secondary | ICD-10-CM | POA: Diagnosis not present

## 2020-01-16 DIAGNOSIS — F419 Anxiety disorder, unspecified: Secondary | ICD-10-CM | POA: Diagnosis not present

## 2020-01-16 DIAGNOSIS — R197 Diarrhea, unspecified: Secondary | ICD-10-CM | POA: Diagnosis not present

## 2020-01-16 DIAGNOSIS — K648 Other hemorrhoids: Secondary | ICD-10-CM | POA: Diagnosis not present

## 2020-01-16 DIAGNOSIS — F329 Major depressive disorder, single episode, unspecified: Secondary | ICD-10-CM | POA: Diagnosis not present

## 2020-01-16 DIAGNOSIS — G894 Chronic pain syndrome: Secondary | ICD-10-CM | POA: Diagnosis not present

## 2020-01-16 DIAGNOSIS — M059 Rheumatoid arthritis with rheumatoid factor, unspecified: Secondary | ICD-10-CM | POA: Diagnosis not present

## 2020-01-16 DIAGNOSIS — E559 Vitamin D deficiency, unspecified: Secondary | ICD-10-CM | POA: Diagnosis not present

## 2020-01-16 DIAGNOSIS — E440 Moderate protein-calorie malnutrition: Secondary | ICD-10-CM | POA: Diagnosis not present

## 2020-01-16 DIAGNOSIS — H04129 Dry eye syndrome of unspecified lacrimal gland: Secondary | ICD-10-CM | POA: Diagnosis not present

## 2020-01-22 DIAGNOSIS — Z7952 Long term (current) use of systemic steroids: Secondary | ICD-10-CM | POA: Diagnosis not present

## 2020-01-25 DIAGNOSIS — M059 Rheumatoid arthritis with rheumatoid factor, unspecified: Secondary | ICD-10-CM | POA: Diagnosis not present

## 2020-01-25 DIAGNOSIS — G894 Chronic pain syndrome: Secondary | ICD-10-CM | POA: Diagnosis not present

## 2020-02-27 DIAGNOSIS — G894 Chronic pain syndrome: Secondary | ICD-10-CM | POA: Diagnosis not present

## 2020-03-12 DIAGNOSIS — G894 Chronic pain syndrome: Secondary | ICD-10-CM | POA: Diagnosis not present

## 2020-03-17 DIAGNOSIS — M059 Rheumatoid arthritis with rheumatoid factor, unspecified: Secondary | ICD-10-CM | POA: Diagnosis not present

## 2020-03-17 DIAGNOSIS — I10 Essential (primary) hypertension: Secondary | ICD-10-CM | POA: Diagnosis not present

## 2020-03-17 DIAGNOSIS — H04129 Dry eye syndrome of unspecified lacrimal gland: Secondary | ICD-10-CM | POA: Diagnosis not present

## 2020-03-17 DIAGNOSIS — G8929 Other chronic pain: Secondary | ICD-10-CM | POA: Diagnosis not present

## 2020-03-17 DIAGNOSIS — F0390 Unspecified dementia without behavioral disturbance: Secondary | ICD-10-CM | POA: Diagnosis not present

## 2020-03-17 DIAGNOSIS — M0680 Other specified rheumatoid arthritis, unspecified site: Secondary | ICD-10-CM | POA: Diagnosis not present

## 2020-03-17 DIAGNOSIS — F419 Anxiety disorder, unspecified: Secondary | ICD-10-CM | POA: Diagnosis not present

## 2020-03-17 DIAGNOSIS — F329 Major depressive disorder, single episode, unspecified: Secondary | ICD-10-CM | POA: Diagnosis not present

## 2020-03-17 DIAGNOSIS — R54 Age-related physical debility: Secondary | ICD-10-CM | POA: Diagnosis not present

## 2020-03-17 DIAGNOSIS — K219 Gastro-esophageal reflux disease without esophagitis: Secondary | ICD-10-CM | POA: Diagnosis not present

## 2020-03-17 DIAGNOSIS — G894 Chronic pain syndrome: Secondary | ICD-10-CM | POA: Diagnosis not present

## 2020-03-17 DIAGNOSIS — E559 Vitamin D deficiency, unspecified: Secondary | ICD-10-CM | POA: Diagnosis not present

## 2020-04-13 ENCOUNTER — Emergency Department (HOSPITAL_COMMUNITY)
Admission: EM | Admit: 2020-04-13 | Discharge: 2020-04-13 | Disposition: A | Discharge status: Skilled Nursing Facility | Payer: Medicare Other | Attending: Emergency Medicine | Admitting: Emergency Medicine

## 2020-04-13 ENCOUNTER — Other Ambulatory Visit: Payer: Self-pay

## 2020-04-13 ENCOUNTER — Emergency Department (HOSPITAL_COMMUNITY): Payer: Medicare Other

## 2020-04-13 ENCOUNTER — Encounter (HOSPITAL_COMMUNITY): Payer: Self-pay

## 2020-04-13 DIAGNOSIS — R404 Transient alteration of awareness: Secondary | ICD-10-CM | POA: Diagnosis not present

## 2020-04-13 DIAGNOSIS — M2550 Pain in unspecified joint: Secondary | ICD-10-CM | POA: Diagnosis not present

## 2020-04-13 DIAGNOSIS — I10 Essential (primary) hypertension: Secondary | ICD-10-CM | POA: Diagnosis not present

## 2020-04-13 DIAGNOSIS — R000 Tachycardia, unspecified: Secondary | ICD-10-CM | POA: Diagnosis not present

## 2020-04-13 DIAGNOSIS — R42 Dizziness and giddiness: Secondary | ICD-10-CM | POA: Diagnosis not present

## 2020-04-13 DIAGNOSIS — I6782 Cerebral ischemia: Secondary | ICD-10-CM | POA: Diagnosis not present

## 2020-04-13 DIAGNOSIS — Z20822 Contact with and (suspected) exposure to covid-19: Secondary | ICD-10-CM | POA: Insufficient documentation

## 2020-04-13 DIAGNOSIS — H748X3 Other specified disorders of middle ear and mastoid, bilateral: Secondary | ICD-10-CM | POA: Diagnosis not present

## 2020-04-13 DIAGNOSIS — N390 Urinary tract infection, site not specified: Secondary | ICD-10-CM | POA: Diagnosis not present

## 2020-04-13 DIAGNOSIS — R410 Disorientation, unspecified: Secondary | ICD-10-CM | POA: Diagnosis not present

## 2020-04-13 DIAGNOSIS — F0390 Unspecified dementia without behavioral disturbance: Secondary | ICD-10-CM | POA: Diagnosis not present

## 2020-04-13 DIAGNOSIS — M2548 Effusion, other site: Secondary | ICD-10-CM | POA: Diagnosis not present

## 2020-04-13 DIAGNOSIS — Z7401 Bed confinement status: Secondary | ICD-10-CM | POA: Diagnosis not present

## 2020-04-13 DIAGNOSIS — I959 Hypotension, unspecified: Secondary | ICD-10-CM | POA: Diagnosis not present

## 2020-04-13 DIAGNOSIS — R531 Weakness: Secondary | ICD-10-CM | POA: Insufficient documentation

## 2020-04-13 DIAGNOSIS — I517 Cardiomegaly: Secondary | ICD-10-CM | POA: Diagnosis not present

## 2020-04-13 LAB — COMPREHENSIVE METABOLIC PANEL
ALT: 12 U/L (ref 0–44)
AST: 18 U/L (ref 15–41)
Albumin: 4 g/dL (ref 3.5–5.0)
Alkaline Phosphatase: 59 U/L (ref 38–126)
Anion gap: 11 (ref 5–15)
BUN: 16 mg/dL (ref 8–23)
CO2: 24 mmol/L (ref 22–32)
Calcium: 9.7 mg/dL (ref 8.9–10.3)
Chloride: 106 mmol/L (ref 98–111)
Creatinine, Ser: 0.8 mg/dL (ref 0.44–1.00)
GFR calc Af Amer: >60 mL/min (ref >60)
GFR calc non Af Amer: >60 mL/min (ref >60)
Glucose, Bld: 98 mg/dL (ref 70–99)
Potassium: 3.8 mmol/L (ref 3.5–5.1)
Sodium: 141 mmol/L (ref 135–145)
Total Bilirubin: 0.8 mg/dL (ref 0.3–1.2)
Total Protein: 6.5 g/dL (ref 6.5–8.1)

## 2020-04-13 LAB — CBC WITH DIFFERENTIAL/PLATELET
Abs Immature Granulocytes: 0.03 10*3/uL (ref 0.00–0.07)
Basophils Absolute: 0.1 10*3/uL (ref 0.0–0.1)
Basophils Relative: 1 %
Eosinophils Absolute: 0.2 10*3/uL (ref 0.0–0.5)
Eosinophils Relative: 2 %
HCT: 46.2 % — ABNORMAL HIGH (ref 36.0–46.0)
Hemoglobin: 14.8 g/dL (ref 12.0–15.0)
Immature Granulocytes: 0 %
Lymphocytes Relative: 43 %
Lymphs Abs: 3.9 10*3/uL (ref 0.7–4.0)
MCH: 28.9 pg (ref 26.0–34.0)
MCHC: 32 g/dL (ref 30.0–36.0)
MCV: 90.2 fL (ref 80.0–100.0)
Monocytes Absolute: 0.8 10*3/uL (ref 0.1–1.0)
Monocytes Relative: 9 %
Neutro Abs: 4 10*3/uL (ref 1.7–7.7)
Neutrophils Relative %: 45 %
Platelets: 320 10*3/uL (ref 150–400)
RBC: 5.12 MIL/uL — ABNORMAL HIGH (ref 3.87–5.11)
RDW: 14.5 % (ref 11.5–15.5)
WBC: 8.9 10*3/uL (ref 4.0–10.5)
nRBC: 0 % (ref 0.0–0.2)

## 2020-04-13 LAB — URINALYSIS, ROUTINE W REFLEX MICROSCOPIC
Bilirubin Urine: NEGATIVE
Glucose, UA: NEGATIVE mg/dL
Ketones, ur: 5 mg/dL — AB
Nitrite: POSITIVE — AB
Protein, ur: 100 mg/dL — AB
Specific Gravity, Urine: 1.015 (ref 1.005–1.030)
pH: 7 (ref 5.0–8.0)

## 2020-04-13 LAB — MAGNESIUM: Magnesium: 2.2 mg/dL (ref 1.7–2.4)

## 2020-04-13 LAB — PROTIME-INR
INR: 1 (ref 0.8–1.2)
Prothrombin Time: 13 seconds (ref 11.4–15.2)

## 2020-04-13 LAB — TROPONIN I (HIGH SENSITIVITY): Troponin I (High Sensitivity): 3 ng/L (ref <18)

## 2020-04-13 LAB — SARS CORONAVIRUS 2 BY RT PCR (HOSPITAL ORDER, PERFORMED IN ~~LOC~~ HOSPITAL LAB): SARS Coronavirus 2: NEGATIVE

## 2020-04-13 LAB — LACTIC ACID, PLASMA
Lactic Acid, Venous: 1 mmol/L (ref 0.5–1.9)
Lactic Acid, Venous: 0.9 mmol/L (ref 0.5–1.9)

## 2020-04-13 LAB — APTT: aPTT: 31 seconds (ref 24–36)

## 2020-04-13 MED ORDER — SODIUM CHLORIDE 0.9 % IV BOLUS
500.0000 mL | Freq: Once | INTRAVENOUS | Status: AC
  Administered 2020-04-13: 500 mL via INTRAVENOUS

## 2020-04-13 MED ORDER — CEPHALEXIN 500 MG PO CAPS: 500.0000 mg | ORAL_CAPSULE | Freq: Two times a day (BID) | ORAL | 0 refills | Status: AC

## 2020-04-13 MED ORDER — SODIUM CHLORIDE 0.9 % IV SOLN
1.0000 g | Freq: Once | INTRAVENOUS | Status: AC
  Administered 2020-04-13: 1 g via INTRAVENOUS
  Filled 2020-04-13: qty 10

## 2020-04-13 NOTE — ED Notes (Signed)
Report called to [facility] nursing staff with no further questions at this time. [facility] called for transport.

## 2020-04-13 NOTE — ED Notes (Signed)
Pt discharged back to [facility] via [facility]. NAD

## 2020-04-13 NOTE — ED Triage Notes (Signed)
Pt arrived via EMS from assisted living, c/o dizziness and generalized weakness, no appetite for about a week. Hx of dementia.

## 2020-04-13 NOTE — ED Provider Notes (Addendum)
[facility]-EMERGENCY DEPT Provider Note   CSN: [id number] Arrival date & time: 04/13/20  1000     History Chief Complaint  Patient presents with  . Dizziness    Sheena Adams is a 79 y.o. female history includes dementia, Parkinson's, fibromyalgia, GERD, hemorrhoids, fibromyalgia, anxiety/depression.  Patient arrives via EMS from assisted living facility for dizziness generalized weakness and decreased appetite x1 week per triage note.  EMS has left prior to initial evaluation.  Patient reports that she has been feeling tired and weak for the past several days, she does not have any specific complaint.  Reports that she is not had any falls or injuries or recent illnesses, denies any pain or additional concerns.  Level 5 caveat dementia.  HPI     Past Medical History:  Diagnosis Date  . Anxiety disorder, unspecified   . Constipation   . Diaphragmatic hernia without obstruction   . Difficulty walking   . Dry eyes   . Encephalopathy, unspecified   . Essential hypertension, benign   . Fibromyalgia   . GERD (gastroesophageal reflux disease)   . Hemorrhoids   . History of falling   . Hypokalemia   . Major depressive disorder with single episode   . Mononeuropathy of lower extremity, left   . Rheumatoid arthritis (HCC)   . Vitamin deficiency    Vitamin D    There are no problems to display for this patient.   History reviewed. No pertinent surgical history.   OB History   No obstetric history on file.     History reviewed. No pertinent family history.  Social History   Tobacco Use  . Smoking status: Never Smoker  . Smokeless tobacco: Never Used  Substance Use Topics  . Alcohol use: Never  . Drug use: Never    Home Medications Prior to Admission medications   Medication Sig Start Date End Date Taking? Authorizing Provider  busPIRone (BUSPAR) 10 MG tablet Take 10 mg by mouth 2 (two) times daily.   Yes [provider]   calcium carbonate (TUMS - DOSED IN MG ELEMENTAL CALCIUM) 500 MG chewable tablet Chew 1 tablet by mouth 2 (two) times daily.   Yes [provider]  cholecalciferol (VITAMIN D3) 25 MCG (1000 UT) tablet Take 2,000 Units by mouth daily.   Yes [provider]  cycloSPORINE (RESTASIS) 0.05 % ophthalmic emulsion Place 1 drop into both eyes 2 (two) times daily.   Yes [provider]  DULoxetine (CYMBALTA) 60 MG capsule Take 60 mg by mouth daily.   Yes [provider]  famotidine (PEPCID) 20 MG tablet Take 20 mg by mouth daily.   Yes [provider]  hydrocortisone (ANUSOL-HC) 2.5 % rectal cream Place 1 application rectally as needed for hemorrhoids or anal itching.   Yes [provider]  hydroxychloroquine (PLAQUENIL) 200 MG tablet Take 200 mg by mouth daily.   Yes [provider]  Menthol 4 MG LOZG Take 1 lozenge by mouth as needed for sore throat.   Yes [provider]  ondansetron (ZOFRAN) 4 MG tablet Take 4 mg by mouth every 6 (six) hours as needed for nausea or vomiting.    Yes [provider]  oxyCODONE-acetaminophen (PERCOCET) 7.5-325 MG tablet Take 1 tablet by mouth every 8 (eight) hours as needed for severe pain.    Yes [provider]  polyethylene glycol (MIRALAX / GLYCOLAX) 17 g packet Take 17 g by mouth daily.   Yes [provider]  PRESCRIPTION MEDICATION Take 1 each by mouth in the morning and at bedtime. Magic Cup   Yes [provider]  traZODone (DESYREL) 100 MG tablet Take 100 mg by mouth at bedtime.   Yes [provider]  carvedilol (COREG) 6.25 MG tablet Take 1 tablet (6.25 mg total) by mouth 2 (two) times daily. Patient not taking: Reported on 04/13/2020 07/10/18   Jake Bathe, MD  cephALEXin (KEFLEX) 500 MG capsule Take 1 capsule (500 mg total) by mouth 2 (two) times daily for 5 days. 04/13/20 04/18/20  Harlene Salts A, PA-C  rifaximin (XIFAXAN) 550 MG TABS tablet  Take 1 tablet (550 mg total) by mouth 3 (three) times daily. Lot #: [id number]   Exp: 10-2023 Lot#: [id number]  Exp: 05-2021 Patient not taking: Reported on 04/13/2020 06/13/19   Benancio Deeds, MD    Allergies    Ace inhibitors, Biaxin [clarithromycin], Lyrica [pregabalin], and Sulfa antibiotics  Review of Systems   Review of Systems  Unable to perform ROS: Dementia    Physical Exam Updated Vital Signs BP 129/81   Pulse 71   Temp 99.6 F (37.6 C) (Rectal)   Resp 20   SpO2 97%   Physical Exam Constitutional:      General: She is not in acute distress.    Appearance: She is well-developed. She is not ill-appearing or diaphoretic.     Comments: Frail  HENT:     Head: Normocephalic and atraumatic.     Jaw: There is normal jaw occlusion.     Right Ear: External ear normal.     Left Ear: External ear normal.  Eyes:     General: Vision grossly intact. Gaze aligned appropriately.     Extraocular Movements: Extraocular movements intact.     Conjunctiva/sclera: Conjunctivae normal.     Pupils: Pupils are equal, round, and reactive to light.  Neck:     Trachea: Trachea and phonation normal. No tracheal tenderness or tracheal deviation.  Cardiovascular:     Rate and Rhythm: Normal rate and regular rhythm.  Pulmonary:     Effort: Pulmonary effort is normal. No respiratory distress.  Abdominal:     General: There is no distension.     Palpations: Abdomen is soft.     Tenderness: There is no abdominal tenderness. There is no guarding or rebound.  Musculoskeletal:        General: Normal range of motion.     Cervical back: Normal range of motion and neck supple.     Comments: No midline C/T/L spinal tenderness to palpation, no paraspinal muscle tenderness, no deformity, crepitus, or step-off noted.  Skin:    General: Skin is warm and dry.  Neurological:     Mental Status: She is alert.     GCS: GCS eye subscore is 4. GCS verbal subscore is 5. GCS motor subscore is 6.     Comments:  Alert to self and place, does not know the year or where she lives Speech is clear and goal oriented, follows commands Major Cranial nerves without deficit, no facial droop Minimal decreased strength with movements of the left upper and lower extremities compared to right. Unclear if this is new.  Moves extremities without ataxia, coordination intact No pronator drift  Psychiatric:        Behavior: Behavior normal.    ED Results / Procedures / Treatments   Labs (all labs ordered are listed, but only abnormal results are displayed) Labs Reviewed  CBC WITH DIFFERENTIAL/PLATELET -  Abnormal; Notable for the following components:      Result Value   RBC 5.12 (*)    HCT 46.2 (*)    All other components within normal limits  URINALYSIS, ROUTINE W REFLEX MICROSCOPIC - Abnormal; Notable for the following components:   APPearance TURBID (*)    Hgb urine dipstick SMALL (*)    Ketones, ur 5 (*)    Protein, ur 100 (*)    Nitrite POSITIVE (*)    Leukocytes,Ua SMALL (*)    Bacteria, UA MANY (*)    All other components within normal limits  SARS CORONAVIRUS 2 BY RT PCR (HOSPITAL ORDER, PERFORMED IN [facility])  CULTURE, BLOOD (SINGLE)  URINE CULTURE  LACTIC ACID, PLASMA  LACTIC ACID, PLASMA  COMPREHENSIVE METABOLIC PANEL  PROTIME-INR  APTT  MAGNESIUM  TROPONIN I (HIGH SENSITIVITY)    EKG EKG Interpretation  Date/Time:  Sunday April 13 2020 11:32:11 EDT Ventricular Rate:  97 PR Interval:    QRS Duration: 99 QT Interval:  396 QTC Calculation: 504 R Axis:   -83 Text Interpretation: Sinus rhythm Inferior infarct, old Prolonged QT interval Confirmed by Raeford Razor [phone]) on 04/13/2020 12:15:31 PM   Radiology CT Head Wo Contrast  Result Date: 04/13/2020 CLINICAL DATA:  Dizziness. Generalized weakness. Dementia. No reported injury. EXAM: CT HEAD WITHOUT CONTRAST TECHNIQUE: Contiguous axial images were obtained from the base of the skull through the vertex without  intravenous contrast. COMPARISON:  None. FINDINGS: Brain: Generalized cerebral volume loss. No evidence of parenchymal hemorrhage or extra-axial fluid collection. No mass lesion, mass effect, or midline shift. No CT evidence of acute infarction. Nonspecific prominent subcortical and periventricular white matter hypodensity, most in keeping with chronic small vessel ischemic change. Symmetric dilatation of the lateral ventricles and third ventricle, compatible with cerebral volume loss. Vascular: No acute abnormality. Skull: No evidence of calvarial fracture. Sinuses/Orbits: The visualized paranasal sinuses are essentially clear. Other: Partial bilateral mastoid effusions. IMPRESSION: 1. No evidence of acute intracranial abnormality. 2. Generalized cerebral volume loss and prominent chronic small vessel ischemic changes in the cerebral white matter. 3. Nonspecific partial bilateral mastoid effusions. Electronically Signed   By: Delbert Phenix M.D.   On: 04/13/2020 11:26   DG Chest Port 1 View  Result Date: 04/13/2020 CLINICAL DATA:  Dementia, possible sepsis, dizziness, weakness EXAM: PORTABLE CHEST 1 VIEW COMPARISON:  09/05/2019 chest radiograph. FINDINGS: Stable cardiomediastinal silhouette with borderline mild cardiomegaly. No pneumothorax. No pleural effusion. Lungs appear clear, with no acute consolidative airspace disease and no pulmonary edema. IMPRESSION: No active disease. Electronically Signed   By: Delbert Phenix M.D.   On: 04/13/2020 11:44    Procedures Procedures (including critical care time)  Medications Ordered in ED Medications  cefTRIAXone (ROCEPHIN) 1 g in sodium chloride 0.9 % 100 mL IVPB (has no administration in time range)  sodium chloride 0.9 % bolus 500 mL (has no administration in time range)    ED Course  I have reviewed the triage vital signs and the nursing notes.  Pertinent labs & imaging results that were available during my care of the patient were reviewed by me and  considered in my medical decision making (see chart for details).  Clinical Course as of Apr 13 1606  Wynelle Link Apr 13, 2020  1041 SARAYA, TIREY): [phone]   [BM]  [phone] [place]: No answer   [BM]  1200 [phone]: No answer   [BM]  1301 [phone]: No answer   [BM]  1431 LAGINA, READER (Son): [phone]   [  BM]  1434 [phone] [place]: tachycardia and flushed face   [BM]  1535 Norgard,LARRY (Son): [phone]   [BM]  1538 [phone]: [place]   [BM]    Clinical Course User Index [BM] Elizabeth Palau   MDM Rules/Calculators/A&P                         Additional history obtained from: 1. Nursing notes from this visit. 2. Electronic medical record. 3. Family. ----------------------------------------- 79 year old female arrived via EMS for apparent decreased p.o. intake dizziness and generalized weakness ongoing for 1 week.  Patient reports that she has been feeling tired and unwell but has no specific complaints and denies any pain.  Vital signs show mild tachycardia heart rate 105 bpm.  She denies any infectious type symptoms but history is limited secondary to dementia.  On exam she is in no acute distress.  She has questionable mydriasis of the left pupil otherwise cranial nerves intact.  No meningeal signs.  Lungs clear.  Abdomen soft and nontender.  She has questionable weakness with movements of the left upper and lower extremity compared to right and endorses some paresthesias of the left side as well, denies any facial paresthesias.  Is unclear if this is new for the patient.  I then attempted to call the facility at [facility] but there was no answer.  I spoke with patient's son Daley Riggan over the phone he advises that patient did have a stroke around 10 years ago but does not know if she has any residual deficits.  He reports that he did receive a phone call from [facility] earlier today and was advised the patient was being  transported to the ER for tachycardia but no other history available.  Currently patient does not meet SIRS/sepsis criteria but will use the evolving sepsis order set to obtain labs.  Additionally will obtain CT of the head to assess for the left-sided weakness.  On chart review there is no prior documentation of left-sided deficits. - Rectal temperature within normal limits, mild tachycardia spontaneously resolved.  No tachypnea.  I ordered, reviewed and interpreted labs which include: CBC shows no leukocytosis to suggest infection and no anemia. CMP within normal limits, no electrolyte derangement, AKI, LFT elevations or gap. Lactic acid within normal limits. Magnesium within normal limits. APTT and PT/INR within normal limits. High-sensitivity troponin within normal limits.  Chest pain-free without shortness of breath doubt ACS. Covid test negative.  CT head:  IMPRESSION:  1. No evidence of acute intracranial abnormality.  2. Generalized cerebral volume loss and prominent chronic small  vessel ischemic changes in the cerebral white matter.  3. Nonspecific partial bilateral mastoid effusions.  Reassuring and patient's family reported mental status baseline, no new neuro deficits, doubt CVA. CXR:  IMPRESSION:  No active disease.   EKG: Sinus rhythm Inferior infarct, old Prolonged QT interval Confirmed by Raeford Razor [phone]) on 04/13/2020 12:15:31 PM  Urinalysis shows evidence of infection.  Urine culture sent. ----------------------------- I was able to contact patient's son Peyton Najjar over the phone, it appears patient's mental status is baseline.  He was able to give me the phone #2 with a nurse supervisor at the facility.  I spoke with [place] RN, advises that patient was in her normal state of health earlier today had noticed patient to be tachycardic with heart rate in the 130s and appeared flush so they called EMS.  Patient's work-up overall reassuring suspect her symptoms today  are secondary to  urinary tract infection.  No pain to suggest kidney stone.  She has no evidence of sirs/sepsis.  Heart rate is within normal limits and tachycardia resolved without any intervention.  Plan of care is to give patient 1 dose of IV Rocephin here and discharged back to her facility on Keflex.  I spoke with both the patient's son over the phone as well as nursing supervisor [place] all parties involved are agreeable to plan of care and discharge.   At this time there does not appear to be any evidence of an acute emergency medical condition and the patient appears stable for discharge with appropriate outpatient follow up. Diagnosis was discussed with patient who verbalizes understanding of care plan and is agreeable to discharge. I have discussed return precautions with patient family and RN who verbalizes understanding. Patient encouraged to follow-up with their PCP . All questions answered.  Patient's case discussed with Dr. Juleen China who agrees with plan to discharge with follow-up.   Note: Portions of this report may have been transcribed using voice recognition software. Every effort was made to ensure accuracy; however, inadvertent computerized transcription errors may still be present. Final Clinical Impression(s) / ED Diagnoses Final diagnoses:  Lower urinary tract infectious disease    Rx / DC Orders ED Discharge Orders         Ordered    cephALEXin (KEFLEX) 500 MG capsule  2 times daily        04/13/20 1606           Elizabeth Palau 04/13/20 1639    Bill Salinas, PA-C 04/13/20 1648    Raeford Razor, MD 04/15/20 1116

## 2020-04-13 NOTE — Discharge Instructions (Addendum)
At this time there does not appear to be the presence of an emergent medical condition, however there is always the potential for conditions to change. Please read and follow the below instructions.  Please return to the Emergency Department immediately for any new or worsening symptoms. Please be sure to follow up with your Primary Care Provider within one week regarding your visit today; please call their office to schedule an appointment even if you are feeling better for a follow-up visit. Please take your antibiotic Keflex as prescribed until complete to help with your symptoms.  Please drink enough water to avoid dehydration and get plenty of rest.  Get help right away if: You have very bad back pain. You have very bad pain in your lower belly. You have a fever. You are sick to your stomach (nauseous). You are throwing up. You have any new/concerning or worsening of symptoms  Please read the additional information packets attached to your discharge summary.  Do not take your medicine if  develop an itchy rash, swelling in your mouth or lips, or difficulty breathing; call 911 and seek immediate emergency medical attention if this occurs.  You may review your lab tests and imaging results in their entirety on your MyChart account.  Please discuss all results of fully with your primary care provider and other specialist at your follow-up visit.  Note: Portions of this text may have been transcribed using voice recognition software. Every effort was made to ensure accuracy; however, inadvertent computerized transcription errors may still be present.

## 2020-04-14 DIAGNOSIS — H04123 Dry eye syndrome of bilateral lacrimal glands: Secondary | ICD-10-CM | POA: Diagnosis not present

## 2020-04-14 DIAGNOSIS — M059 Rheumatoid arthritis with rheumatoid factor, unspecified: Secondary | ICD-10-CM | POA: Diagnosis not present

## 2020-04-14 DIAGNOSIS — F419 Anxiety disorder, unspecified: Secondary | ICD-10-CM | POA: Diagnosis not present

## 2020-04-14 DIAGNOSIS — F0390 Unspecified dementia without behavioral disturbance: Secondary | ICD-10-CM | POA: Diagnosis not present

## 2020-04-14 DIAGNOSIS — H109 Unspecified conjunctivitis: Secondary | ICD-10-CM | POA: Diagnosis not present

## 2020-04-14 DIAGNOSIS — H04129 Dry eye syndrome of unspecified lacrimal gland: Secondary | ICD-10-CM | POA: Diagnosis not present

## 2020-04-14 DIAGNOSIS — K219 Gastro-esophageal reflux disease without esophagitis: Secondary | ICD-10-CM | POA: Diagnosis not present

## 2020-04-14 DIAGNOSIS — M0680 Other specified rheumatoid arthritis, unspecified site: Secondary | ICD-10-CM | POA: Diagnosis not present

## 2020-04-14 DIAGNOSIS — R54 Age-related physical debility: Secondary | ICD-10-CM | POA: Diagnosis not present

## 2020-04-14 DIAGNOSIS — G894 Chronic pain syndrome: Secondary | ICD-10-CM | POA: Diagnosis not present

## 2020-04-14 DIAGNOSIS — F329 Major depressive disorder, single episode, unspecified: Secondary | ICD-10-CM | POA: Diagnosis not present

## 2020-04-14 DIAGNOSIS — E559 Vitamin D deficiency, unspecified: Secondary | ICD-10-CM | POA: Diagnosis not present

## 2020-04-14 LAB — URINE CULTURE: Culture: >=100000 — AB

## 2020-04-16 DIAGNOSIS — G894 Chronic pain syndrome: Secondary | ICD-10-CM | POA: Diagnosis not present

## 2020-04-18 DIAGNOSIS — G894 Chronic pain syndrome: Secondary | ICD-10-CM | POA: Diagnosis not present

## 2020-04-18 DIAGNOSIS — R54 Age-related physical debility: Secondary | ICD-10-CM | POA: Diagnosis not present

## 2020-04-18 DIAGNOSIS — K219 Gastro-esophageal reflux disease without esophagitis: Secondary | ICD-10-CM | POA: Diagnosis not present

## 2020-04-18 DIAGNOSIS — M059 Rheumatoid arthritis with rheumatoid factor, unspecified: Secondary | ICD-10-CM | POA: Diagnosis not present

## 2020-04-18 DIAGNOSIS — E559 Vitamin D deficiency, unspecified: Secondary | ICD-10-CM | POA: Diagnosis not present

## 2020-04-18 LAB — CULTURE, BLOOD (SINGLE)
Culture: NO GROWTH
Special Requests: ADEQUATE

## 2020-04-24 DIAGNOSIS — Z7952 Long term (current) use of systemic steroids: Secondary | ICD-10-CM | POA: Diagnosis not present

## 2020-05-12 DIAGNOSIS — E559 Vitamin D deficiency, unspecified: Secondary | ICD-10-CM | POA: Diagnosis not present

## 2020-05-12 DIAGNOSIS — F0390 Unspecified dementia without behavioral disturbance: Secondary | ICD-10-CM | POA: Diagnosis not present

## 2020-05-12 DIAGNOSIS — H04123 Dry eye syndrome of bilateral lacrimal glands: Secondary | ICD-10-CM | POA: Diagnosis not present

## 2020-05-12 DIAGNOSIS — M059 Rheumatoid arthritis with rheumatoid factor, unspecified: Secondary | ICD-10-CM | POA: Diagnosis not present

## 2020-05-12 DIAGNOSIS — R54 Age-related physical debility: Secondary | ICD-10-CM | POA: Diagnosis not present

## 2020-05-12 DIAGNOSIS — G894 Chronic pain syndrome: Secondary | ICD-10-CM | POA: Diagnosis not present

## 2020-05-12 DIAGNOSIS — H04129 Dry eye syndrome of unspecified lacrimal gland: Secondary | ICD-10-CM | POA: Diagnosis not present

## 2020-05-12 DIAGNOSIS — K219 Gastro-esophageal reflux disease without esophagitis: Secondary | ICD-10-CM | POA: Diagnosis not present

## 2020-05-13 DIAGNOSIS — M24571 Contracture, right ankle: Secondary | ICD-10-CM | POA: Diagnosis not present

## 2020-05-13 DIAGNOSIS — I739 Peripheral vascular disease, unspecified: Secondary | ICD-10-CM | POA: Diagnosis not present

## 2020-05-13 DIAGNOSIS — R262 Difficulty in walking, not elsewhere classified: Secondary | ICD-10-CM | POA: Diagnosis not present

## 2020-05-13 DIAGNOSIS — L602 Onychogryphosis: Secondary | ICD-10-CM | POA: Diagnosis not present

## 2020-05-21 DIAGNOSIS — R300 Dysuria: Secondary | ICD-10-CM | POA: Diagnosis not present

## 2020-05-21 DIAGNOSIS — G894 Chronic pain syndrome: Secondary | ICD-10-CM | POA: Diagnosis not present

## 2020-05-22 DIAGNOSIS — N390 Urinary tract infection, site not specified: Secondary | ICD-10-CM | POA: Diagnosis not present

## 2020-06-09 DIAGNOSIS — I10 Essential (primary) hypertension: Secondary | ICD-10-CM | POA: Diagnosis not present

## 2020-06-09 DIAGNOSIS — F419 Anxiety disorder, unspecified: Secondary | ICD-10-CM | POA: Diagnosis not present

## 2020-06-09 DIAGNOSIS — H04123 Dry eye syndrome of bilateral lacrimal glands: Secondary | ICD-10-CM | POA: Diagnosis not present

## 2020-06-09 DIAGNOSIS — G894 Chronic pain syndrome: Secondary | ICD-10-CM | POA: Diagnosis not present

## 2020-06-09 DIAGNOSIS — K219 Gastro-esophageal reflux disease without esophagitis: Secondary | ICD-10-CM | POA: Diagnosis not present

## 2020-06-09 DIAGNOSIS — H04129 Dry eye syndrome of unspecified lacrimal gland: Secondary | ICD-10-CM | POA: Diagnosis not present

## 2020-06-09 DIAGNOSIS — F0390 Unspecified dementia without behavioral disturbance: Secondary | ICD-10-CM | POA: Diagnosis not present

## 2020-06-09 DIAGNOSIS — M0680 Other specified rheumatoid arthritis, unspecified site: Secondary | ICD-10-CM | POA: Diagnosis not present

## 2020-06-09 DIAGNOSIS — E559 Vitamin D deficiency, unspecified: Secondary | ICD-10-CM | POA: Diagnosis not present

## 2020-06-09 DIAGNOSIS — R54 Age-related physical debility: Secondary | ICD-10-CM | POA: Diagnosis not present

## 2020-06-09 DIAGNOSIS — R300 Dysuria: Secondary | ICD-10-CM | POA: Diagnosis not present

## 2020-06-09 DIAGNOSIS — M059 Rheumatoid arthritis with rheumatoid factor, unspecified: Secondary | ICD-10-CM | POA: Diagnosis not present

## 2020-06-16 DIAGNOSIS — Z23 Encounter for immunization: Secondary | ICD-10-CM | POA: Diagnosis not present

## 2020-06-17 DIAGNOSIS — E559 Vitamin D deficiency, unspecified: Secondary | ICD-10-CM | POA: Diagnosis not present

## 2020-06-17 DIAGNOSIS — H04123 Dry eye syndrome of bilateral lacrimal glands: Secondary | ICD-10-CM | POA: Diagnosis not present

## 2020-06-17 DIAGNOSIS — K219 Gastro-esophageal reflux disease without esophagitis: Secondary | ICD-10-CM | POA: Diagnosis not present

## 2020-06-17 DIAGNOSIS — K648 Other hemorrhoids: Secondary | ICD-10-CM | POA: Diagnosis not present

## 2020-06-17 DIAGNOSIS — M059 Rheumatoid arthritis with rheumatoid factor, unspecified: Secondary | ICD-10-CM | POA: Diagnosis not present

## 2020-06-17 DIAGNOSIS — G894 Chronic pain syndrome: Secondary | ICD-10-CM | POA: Diagnosis not present

## 2020-06-25 DIAGNOSIS — R319 Hematuria, unspecified: Secondary | ICD-10-CM | POA: Diagnosis not present

## 2020-06-25 DIAGNOSIS — G894 Chronic pain syndrome: Secondary | ICD-10-CM | POA: Diagnosis not present

## 2020-06-26 DIAGNOSIS — N390 Urinary tract infection, site not specified: Secondary | ICD-10-CM | POA: Diagnosis not present

## 2020-06-26 DIAGNOSIS — H2512 Age-related nuclear cataract, left eye: Secondary | ICD-10-CM | POA: Diagnosis not present

## 2020-06-26 DIAGNOSIS — H353131 Nonexudative age-related macular degeneration, bilateral, early dry stage: Secondary | ICD-10-CM | POA: Diagnosis not present

## 2020-06-26 DIAGNOSIS — H02409 Unspecified ptosis of unspecified eyelid: Secondary | ICD-10-CM | POA: Diagnosis not present

## 2020-06-26 DIAGNOSIS — Z961 Presence of intraocular lens: Secondary | ICD-10-CM | POA: Diagnosis not present

## 2020-07-30 DIAGNOSIS — G894 Chronic pain syndrome: Secondary | ICD-10-CM | POA: Diagnosis not present

## 2020-07-30 DIAGNOSIS — M059 Rheumatoid arthritis with rheumatoid factor, unspecified: Secondary | ICD-10-CM | POA: Diagnosis not present

## 2020-08-13 DIAGNOSIS — R300 Dysuria: Secondary | ICD-10-CM | POA: Diagnosis not present

## 2020-08-14 DIAGNOSIS — N390 Urinary tract infection, site not specified: Secondary | ICD-10-CM | POA: Diagnosis not present

## 2021-01-03 IMAGING — DX DG CHEST 1V PORT
1 series · 1 of 1 positions shown · non-contrast
Comparison: None.

CLINICAL DATA: Shortness of breath, COVID positive.

EXAM:
PORTABLE CHEST 1 VIEW

[chest ap]
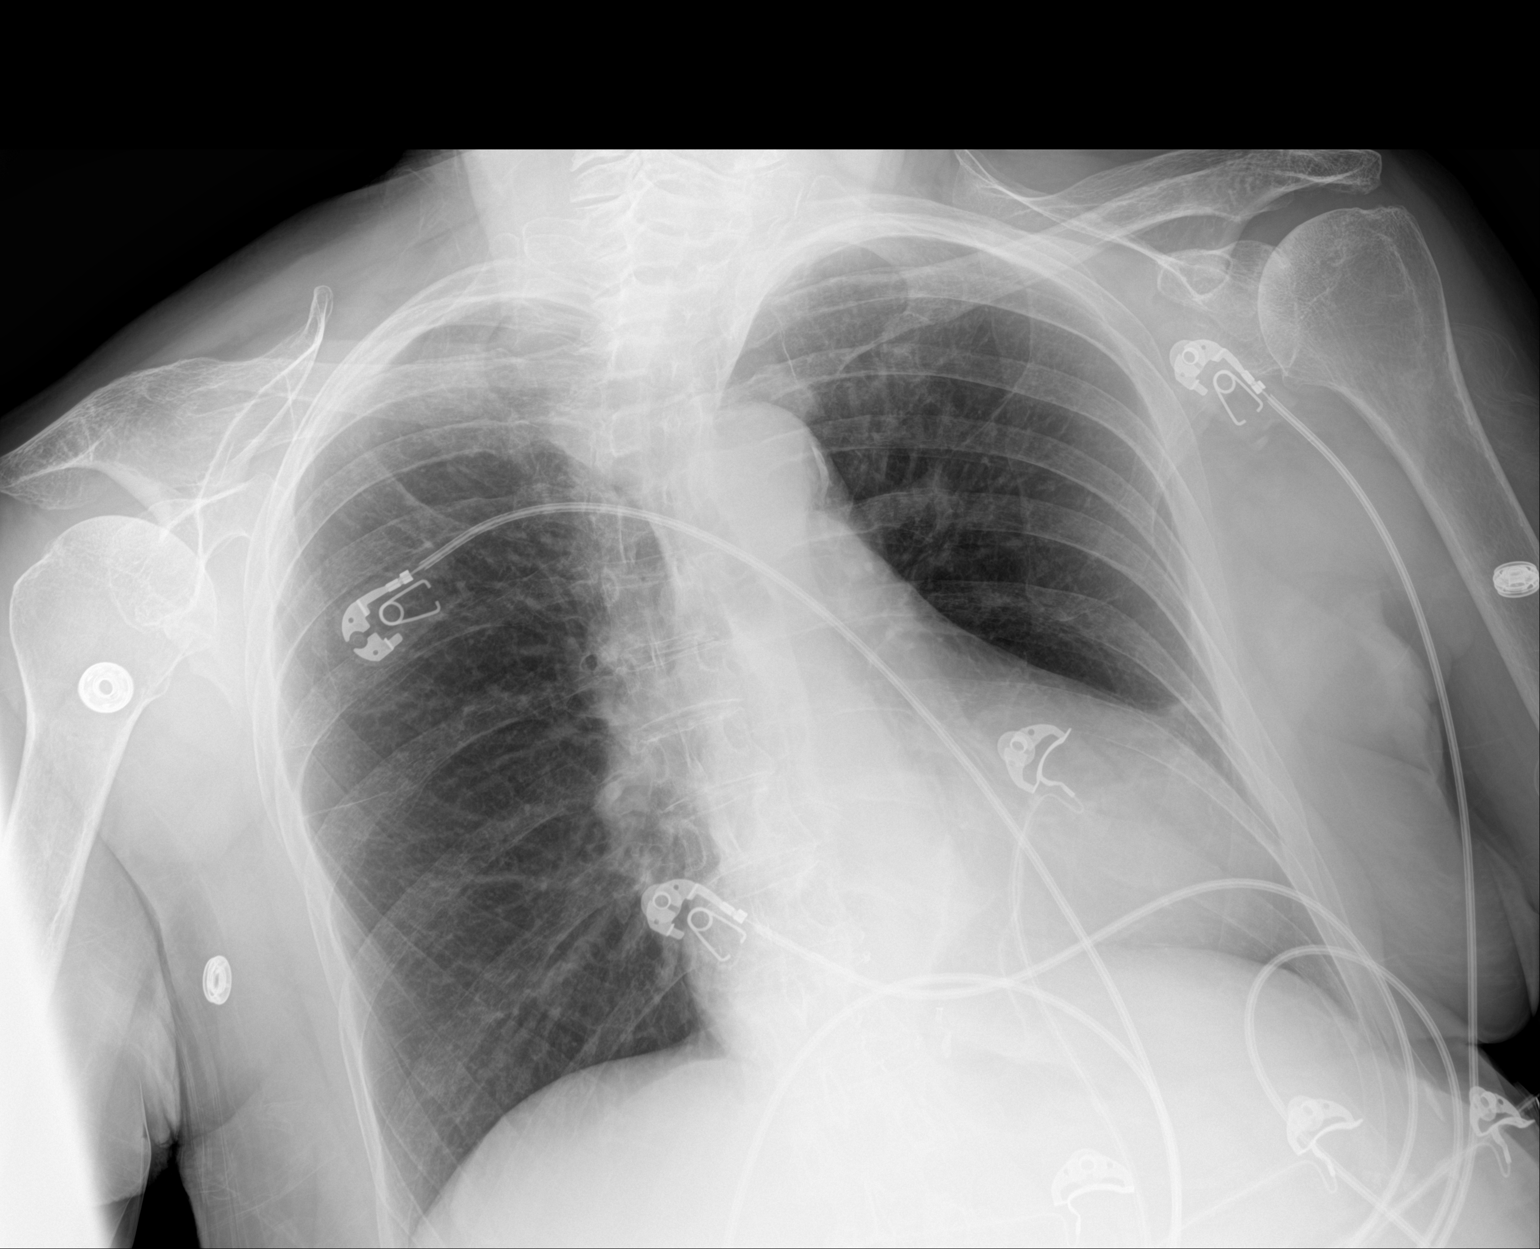

[1 of 1 positions shown; findings below may reference images not displayed]

FINDINGS: Patient is rotated. Heart is enlarged. Thoracic aorta is calcified.
Probable focal scarring in the lingula. Lungs are otherwise clear.
No pleural fluid.
IMPRESSION: No acute findings.

## 2021-08-12 IMAGING — DX DG CHEST 1V PORT
1 series · 1 of 1 positions shown · non-contrast
Comparison: 09/05/2019 chest radiograph.

CLINICAL DATA: Dementia, possible sepsis, dizziness, weakness

EXAM:
PORTABLE CHEST 1 VIEW

[chest ap]
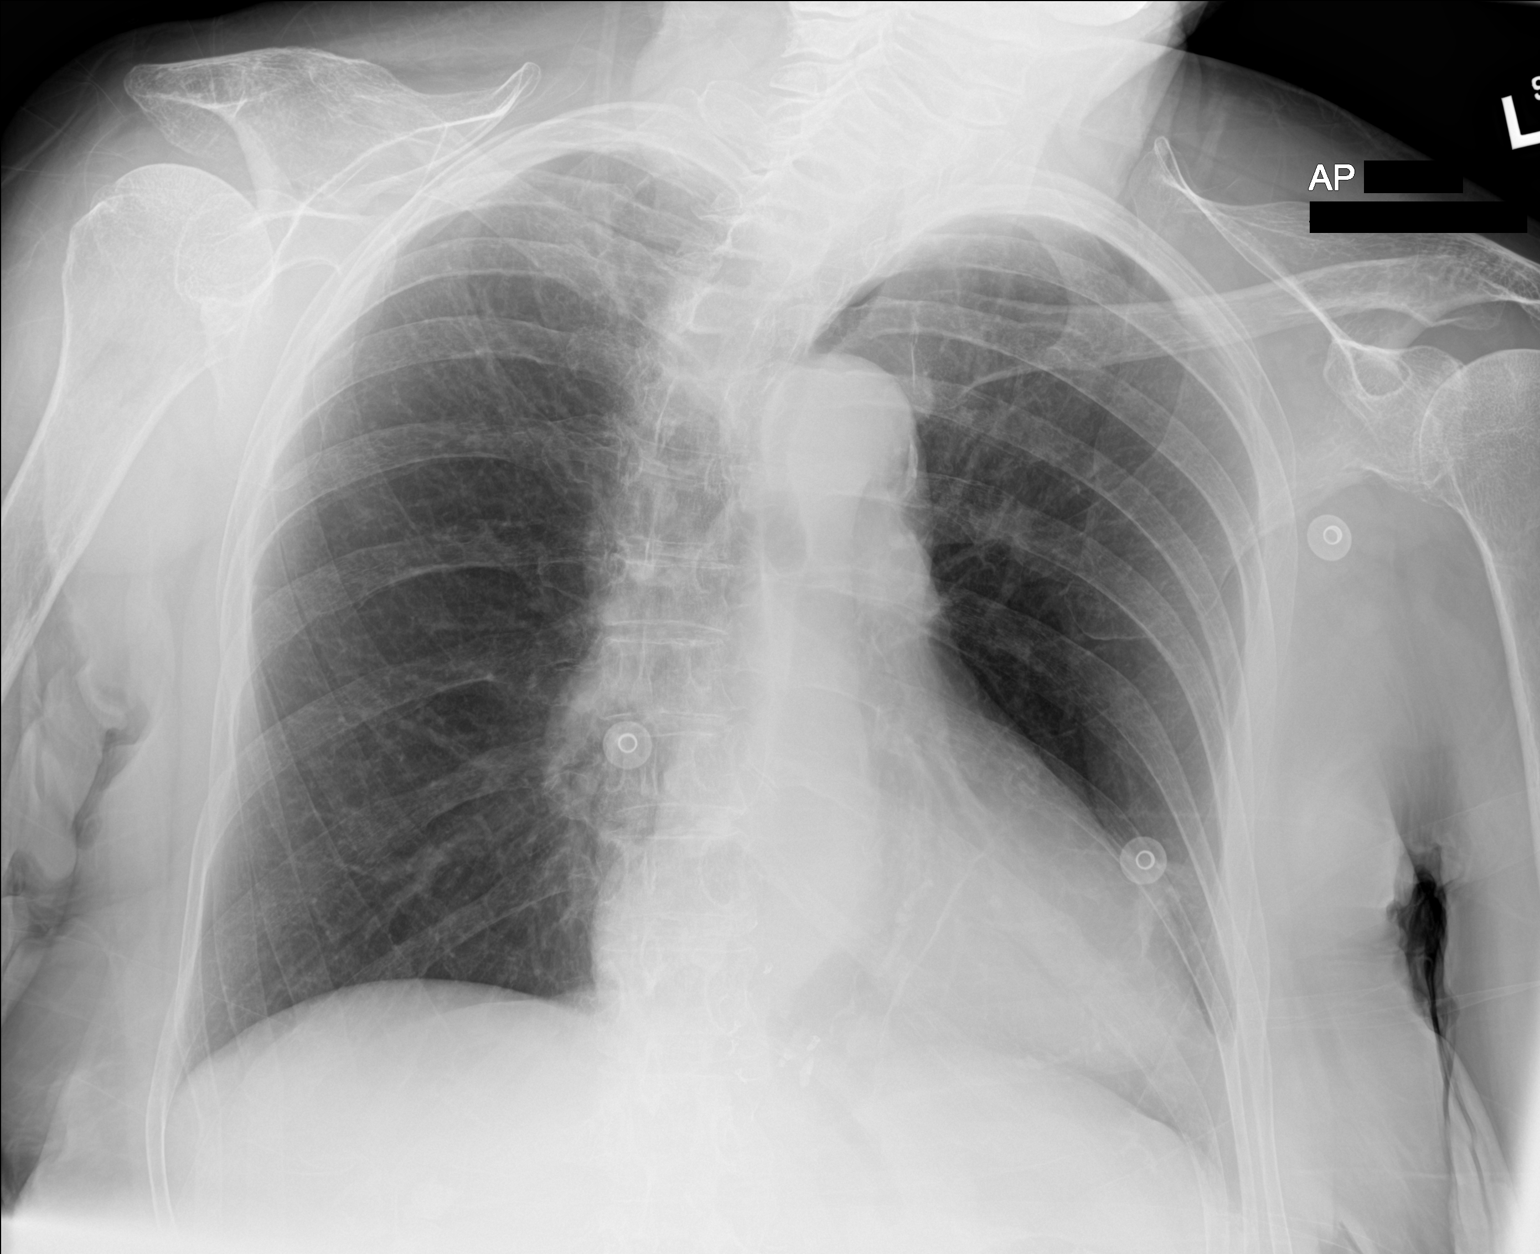

[1 of 1 positions shown; findings below may reference images not displayed]

FINDINGS: Stable cardiomediastinal silhouette with borderline mild
cardiomegaly. No pneumothorax. No pleural effusion. Lungs appear
clear, with no acute consolidative airspace disease and no pulmonary
edema.
IMPRESSION: No active disease.

## 2021-09-29 ENCOUNTER — Emergency Department (HOSPITAL_COMMUNITY)
Admission: EM | Admit: 2021-09-29 | Discharge: 2021-09-29 | Disposition: A | Discharge status: Home / Self Care | Payer: Medicare Other | Attending: Emergency Medicine | Admitting: Emergency Medicine

## 2021-09-29 ENCOUNTER — Emergency Department (HOSPITAL_COMMUNITY): Payer: Medicare Other

## 2021-09-29 ENCOUNTER — Other Ambulatory Visit: Payer: Self-pay

## 2021-09-29 ENCOUNTER — Encounter (HOSPITAL_COMMUNITY): Payer: Self-pay

## 2021-09-29 DIAGNOSIS — R2243 Localized swelling, mass and lump, lower limb, bilateral: Secondary | ICD-10-CM | POA: Insufficient documentation

## 2021-09-29 DIAGNOSIS — R519 Headache, unspecified: Secondary | ICD-10-CM | POA: Insufficient documentation

## 2021-09-29 DIAGNOSIS — Z20822 Contact with and (suspected) exposure to covid-19: Secondary | ICD-10-CM | POA: Insufficient documentation

## 2021-09-29 DIAGNOSIS — W19XXXA Unspecified fall, initial encounter: Secondary | ICD-10-CM

## 2021-09-29 DIAGNOSIS — T1490XA Injury, unspecified, initial encounter: Secondary | ICD-10-CM

## 2021-09-29 DIAGNOSIS — R110 Nausea: Secondary | ICD-10-CM | POA: Diagnosis not present

## 2021-09-29 DIAGNOSIS — R5383 Other fatigue: Secondary | ICD-10-CM | POA: Diagnosis not present

## 2021-09-29 DIAGNOSIS — F0390 Unspecified dementia without behavioral disturbance: Secondary | ICD-10-CM | POA: Insufficient documentation

## 2021-09-29 DIAGNOSIS — R600 Localized edema: Secondary | ICD-10-CM | POA: Diagnosis not present

## 2021-09-29 DIAGNOSIS — W06XXXA Fall from bed, initial encounter: Secondary | ICD-10-CM | POA: Diagnosis not present

## 2021-09-29 DIAGNOSIS — N289 Disorder of kidney and ureter, unspecified: Secondary | ICD-10-CM | POA: Diagnosis not present

## 2021-09-29 LAB — CBC
HCT: 32.3 % — ABNORMAL LOW (ref 36.0–46.0)
Hemoglobin: 10.1 g/dL — ABNORMAL LOW (ref 12.0–15.0)
MCH: 26.9 pg (ref 26.0–34.0)
MCHC: 31.3 g/dL (ref 30.0–36.0)
MCV: 86.1 fL (ref 80.0–100.0)
Platelets: 442 10*3/uL — ABNORMAL HIGH (ref 150–400)
RBC: 3.75 MIL/uL — ABNORMAL LOW (ref 3.87–5.11)
RDW: 15.3 % (ref 11.5–15.5)
WBC: 11.1 10*3/uL — ABNORMAL HIGH (ref 4.0–10.5)
nRBC: 0 % (ref 0.0–0.2)

## 2021-09-29 LAB — COMPREHENSIVE METABOLIC PANEL
ALT: 10 U/L (ref 0–44)
AST: 22 U/L (ref 15–41)
Albumin: 2.8 g/dL — ABNORMAL LOW (ref 3.5–5.0)
Alkaline Phosphatase: 62 U/L (ref 38–126)
Anion gap: 8 (ref 5–15)
BUN: 16 mg/dL (ref 8–23)
CO2: 25 mmol/L (ref 22–32)
Calcium: 8.7 mg/dL — ABNORMAL LOW (ref 8.9–10.3)
Chloride: 103 mmol/L (ref 98–111)
Creatinine, Ser: 1.42 mg/dL — ABNORMAL HIGH (ref 0.44–1.00)
GFR, Estimated: 37 mL/min — ABNORMAL LOW (ref >60)
Glucose, Bld: 103 mg/dL — ABNORMAL HIGH (ref 70–99)
Potassium: 3.9 mmol/L (ref 3.5–5.1)
Sodium: 136 mmol/L (ref 135–145)
Total Bilirubin: 0.7 mg/dL (ref 0.3–1.2)
Total Protein: 6.5 g/dL (ref 6.5–8.1)

## 2021-09-29 LAB — RESP PANEL BY RT-PCR (FLU A&B, COVID) ARPGX2
Influenza A by PCR: NEGATIVE
Influenza B by PCR: NEGATIVE
SARS Coronavirus 2 by RT PCR: NEGATIVE

## 2021-09-29 LAB — PROTIME-INR
INR: 1.1 (ref 0.8–1.2)
Prothrombin Time: 13.8 seconds (ref 11.4–15.2)

## 2021-09-29 LAB — MAGNESIUM: Magnesium: 2 mg/dL (ref 1.7–2.4)

## 2021-09-29 LAB — CK: Total CK: 39 U/L (ref 38–234)

## 2021-09-29 MED ORDER — ONDANSETRON 4 MG PO TBDP
4.0000 mg | ORAL_TABLET | Freq: Once | ORAL | Status: AC
  Administered 2021-09-29: 4 mg via ORAL
  Filled 2021-09-29: qty 1

## 2021-09-29 MED ORDER — ONDANSETRON HCL 4 MG/2ML IJ SOLN: 4.0000 mg | Freq: Once | INTRAMUSCULAR | Status: DC

## 2021-09-29 MED ORDER — LACTATED RINGERS IV BOLUS: 500.0000 mL | Freq: Once | INTRAVENOUS | Status: DC

## 2021-09-29 NOTE — ED Notes (Signed)
Patient transported to CT 

## 2021-09-29 NOTE — Discharge Instructions (Signed)
Fall precautions at the facility  Continue current meds.  Her kidney function is slightly abnormal so please keep her hydrated.  I recommend repeating kidney function test in a week  Return to ER if she has another fall, vomiting, fevers.

## 2021-09-29 NOTE — ED Triage Notes (Signed)
Pt arrived via [facility] from [facility] for a unwitnessed fall. Pt hit head. Per EMS, pt not on blood thinners. Pt c/o nausea and head pain. Pt is A&Ox1 to self only. VSS

## 2021-09-29 NOTE — ED Notes (Signed)
Pt was incontinent of urine. Cleaned pt and applied a clean brief. 

## 2021-09-29 NOTE — ED Provider Notes (Signed)
°  Physical Exam  BP (!) 150/95    Pulse 98    Temp 98.4 F (36.9 C) (Oral)    Resp (!) 23    Ht 5\' 6"  (1.676 m)    Wt 46.6 kg    SpO2 96%    BMI 16.58 kg/m   Physical Exam  Procedures  Procedures  ED Course / MDM    Medical Decision Making Care assumed at 3 PM.  Patient is from a nursing home and is here after a fall.  CT head and cervical spine unremarkable in signout pending labs.  5:32 PM Labs showed creatinine of 1.4 which is slightly elevated compared to previous.  Patient is eating and drinking at bedside.  Patient CK level is normal and magnesium levels normal. We will hold off on IV fluids for now.  Imaging did not show any fractures.  At this point, stable for discharge back to facility.  Will recommend repeat kidney function test in a week after hydration    Problems Addressed: Renal insufficiency: acute illness or injury Trauma: acute illness or injury  Amount and/or Complexity of Data Reviewed Labs: ordered. Decision-making details documented in ED Course. Radiology: ordered and independent interpretation performed. Decision-making details documented in ED Course. ECG/medicine tests: ordered and independent interpretation performed. Decision-making details documented in ED Course.  Risk Prescription drug management.        Charlynne Pander, MD 09/29/21 [phone]

## 2021-09-29 NOTE — ED Provider Notes (Signed)
[facility] EMERGENCY DEPARTMENT Provider Note   CSN: [id number] Arrival date & time: 09/29/21  1302     History  Chief Complaint  Patient presents with   Cleatis Polka RECIA SONS is a 81 y.o. female.  HPI Patient presents after a fall.  She has a history of dementia.  At baseline, she is bed and wheelchair bound.  History is provided by EMS as well as staff at her nursing facility, [facility].  Earlier today at around 11:30 AM, patient had unwitnessed fall out of bed.  She did state that she struck her head and was subsequently complaining of a headache.  For this reason, EMS was called.  Patient denied any other areas of pain with nursing facility staff.  With EMS, she denies any headache pain but does state that she feels sore all over.  She did develop some nausea during transit.  On arrival, she does endorse continued nausea.  She denies any current areas of pain.    Home Medications Prior to Admission medications   Medication Sig Start Date End Date Taking? Authorizing Provider  busPIRone (BUSPAR) 10 MG tablet Take 10 mg by mouth 2 (two) times daily.    [provider]  calcium carbonate (TUMS - DOSED IN MG ELEMENTAL CALCIUM) 500 MG chewable tablet Chew 1 tablet by mouth 2 (two) times daily.    [provider]  carvedilol (COREG) 6.25 MG tablet Take 1 tablet (6.25 mg total) by mouth 2 (two) times daily. Patient not taking: Reported on 04/13/2020 07/10/18   Jake Bathe, MD  cholecalciferol (VITAMIN D3) 25 MCG (1000 UT) tablet Take 2,000 Units by mouth daily.    [provider]  cycloSPORINE (RESTASIS) 0.05 % ophthalmic emulsion Place 1 drop into both eyes 2 (two) times daily.    [provider]  DULoxetine (CYMBALTA) 60 MG capsule Take 60 mg by mouth daily.    [provider]  famotidine (PEPCID) 20 MG tablet Take 20 mg by mouth daily.    [provider]  hydrocortisone (ANUSOL-HC) 2.5 % rectal cream  Place 1 application rectally as needed for hemorrhoids or anal itching.    [provider]  hydroxychloroquine (PLAQUENIL) 200 MG tablet Take 200 mg by mouth daily.    [provider]  Menthol 4 MG LOZG Take 1 lozenge by mouth as needed for sore throat.    [provider]  ondansetron (ZOFRAN) 4 MG tablet Take 4 mg by mouth every 6 (six) hours as needed for nausea or vomiting.     [provider]  oxyCODONE-acetaminophen (PERCOCET) 7.5-325 MG tablet Take 1 tablet by mouth every 8 (eight) hours as needed for severe pain.     [provider]  polyethylene glycol (MIRALAX / GLYCOLAX) 17 g packet Take 17 g by mouth daily.    [provider]  PRESCRIPTION MEDICATION Take 1 each by mouth in the morning and at bedtime. Magic Cup    [provider]  rifaximin (XIFAXAN) 550 MG TABS tablet Take 1 tablet (550 mg total) by mouth 3 (three) times daily. Lot #: [id number]   Exp: 10-2023 Lot#: [id number]  Exp: [id number] Patient not taking: Reported on 04/13/2020 06/13/19   Benancio Deeds, MD  traZODone (DESYREL) 100 MG tablet Take 100 mg by mouth at bedtime.    [provider]      Allergies    Ace inhibitors, Biaxin [clarithromycin], Lyrica [pregabalin], and Sulfa antibiotics  Review of Systems   Review of Systems  Unable to perform ROS: Dementia  Constitutional:  Positive for fatigue.  Gastrointestinal:  Positive for nausea.   Physical Exam Updated Vital Signs BP (!) 162/92    Pulse 99    Temp 98.4 F (36.9 C) (Oral)    Resp (!) 21    Ht  (1.676 m)    Wt 46.6 kg    SpO2 94%    BMI 16.58 kg/m  Physical Exam Vitals and nursing note reviewed.  Constitutional:      General: She is not in acute distress.    Appearance: She is well-developed and normal weight. She is not toxic-appearing or diaphoretic.  HENT:     Head: Normocephalic and atraumatic.     Right Ear: External ear normal.     Left Ear: External ear normal.     Nose:  Nose normal.     Mouth/Throat:     Mouth: Mucous membranes are moist.     Pharynx: Oropharynx is clear.  Eyes:     Extraocular Movements: Extraocular movements intact.     Conjunctiva/sclera: Conjunctivae normal.  Cardiovascular:     Rate and Rhythm: Normal rate and regular rhythm.     Heart sounds: No murmur heard. Pulmonary:     Effort: Pulmonary effort is normal. No respiratory distress.     Breath sounds: Normal breath sounds. No wheezing or rales.  Abdominal:     Palpations: Abdomen is soft.     Tenderness: There is no abdominal tenderness.  Musculoskeletal:        General: Tenderness (Diffuse bilateral lower leg, ankle, and feet tenderness) present. No swelling.     Cervical back: Neck supple. No tenderness.     Right lower leg: Edema present.     Left lower leg: Edema present.  Skin:    General: Skin is warm and dry.     Capillary Refill: Capillary refill takes less than 2 seconds.     Coloration: Skin is not jaundiced or pale.  Neurological:     General: No focal deficit present.     Mental Status: She is alert. Mental status is at baseline.     Comments: Oriented to self only.  Baseline slight left hemibody weakness.  Psychiatric:        Mood and Affect: Mood normal.        Behavior: Behavior normal.    ED Results / Procedures / Treatments   Labs (all labs ordered are listed, but only abnormal results are displayed) Labs Reviewed  COMPREHENSIVE METABOLIC PANEL - Abnormal; Notable for the following components:      Result Value   Glucose, Bld 103 (*)    Creatinine, Ser 1.42 (*)    Calcium 8.7 (*)    Albumin 2.8 (*)    GFR, Estimated 37 (*)    All other components within normal limits  CBC - Abnormal; Notable for the following components:   WBC 11.1 (*)    RBC 3.75 (*)    Hemoglobin 10.1 (*)    HCT 32.3 (*)    Platelets 442 (*)    All other components within normal limits  RESP PANEL BY RT-PCR (FLU A&B, COVID) ARPGX2  PROTIME-INR  CK  MAGNESIUM  I-STAT  CHEM 8, ED    EKG EKG Interpretation  Date/Time:  Tuesday September 29 2021 13:39:52 EST Ventricular Rate:  89 PR Interval:  205 QRS Duration: 104 QT Interval:  420 QTC Calculation: 512 R Axis:   -  67 Text Interpretation: Sinus rhythm Low voltage, precordial leads Probable anteroseptal infarct, old Prolonged QT interval Confirmed by Gloris Manchester [phone]) on 09/29/2021 2:50:47 PM  Radiology CT HEAD WO CONTRAST  Result Date: 09/29/2021 CLINICAL DATA:  Unwitnessed fall. EXAM: CT HEAD WITHOUT CONTRAST CT CERVICAL SPINE WITHOUT CONTRAST TECHNIQUE: Multidetector CT imaging of the head and cervical spine was performed following the standard protocol without intravenous contrast. Multiplanar CT image reconstructions of the cervical spine were also generated. RADIATION DOSE REDUCTION: This exam was performed according to the departmental dose-optimization program which includes automated exposure control, adjustment of the mA and/or kV according to patient size and/or use of iterative reconstruction technique. COMPARISON:  CT head dated April 13, 2020. FINDINGS: CT HEAD FINDINGS Brain: No evidence of acute infarction, hemorrhage, hydrocephalus, extra-axial collection or mass lesion/mass effect. Stable moderate atrophy and chronic microvascular ischemic changes. Vascular: Atherosclerotic vascular calcification of the carotid siphons. No hyperdense vessel. Skull: Normal. Negative for fracture or focal lesion. Sinuses/Orbits: No acute finding. Other: None. CT CERVICAL SPINE FINDINGS Alignment: No traumatic malalignment. Trace anterolisthesis at C3-C4, C7-T1, and T1-T2. Skull base and vertebrae: No acute fracture. No primary bone lesion or focal pathologic process. Advanced left-sided atlanto-occipital osteoarthritis. Soft tissues and spinal canal: No prevertebral fluid or swelling. No visible canal hematoma. Disc levels: Small central disc protrusion and mild uncovertebral hypertrophy at C3-C4. Severe disc height loss  and moderate uncovertebral hypertrophy at C5-C6. Scattered moderate facet arthropathy throughout the cervical spine. Upper chest: Negative. Other: None. IMPRESSION: 1. No acute intracranial abnormality. 2. No acute cervical spine fracture or traumatic listhesis. Electronically Signed   By: Obie Dredge M.D.   On: 09/29/2021 15:14   CT CERVICAL SPINE WO CONTRAST  Result Date: 09/29/2021 CLINICAL DATA:  Unwitnessed fall. EXAM: CT HEAD WITHOUT CONTRAST CT CERVICAL SPINE WITHOUT CONTRAST TECHNIQUE: Multidetector CT imaging of the head and cervical spine was performed following the standard protocol without intravenous contrast. Multiplanar CT image reconstructions of the cervical spine were also generated. RADIATION DOSE REDUCTION: This exam was performed according to the departmental dose-optimization program which includes automated exposure control, adjustment of the mA and/or kV according to patient size and/or use of iterative reconstruction technique. COMPARISON:  CT head dated April 13, 2020. FINDINGS: CT HEAD FINDINGS Brain: No evidence of acute infarction, hemorrhage, hydrocephalus, extra-axial collection or mass lesion/mass effect. Stable moderate atrophy and chronic microvascular ischemic changes. Vascular: Atherosclerotic vascular calcification of the carotid siphons. No hyperdense vessel. Skull: Normal. Negative for fracture or focal lesion. Sinuses/Orbits: No acute finding. Other: None. CT CERVICAL SPINE FINDINGS Alignment: No traumatic malalignment. Trace anterolisthesis at C3-C4, C7-T1, and T1-T2. Skull base and vertebrae: No acute fracture. No primary bone lesion or focal pathologic process. Advanced left-sided atlanto-occipital osteoarthritis. Soft tissues and spinal canal: No prevertebral fluid or swelling. No visible canal hematoma. Disc levels: Small central disc protrusion and mild uncovertebral hypertrophy at C3-C4. Severe disc height loss and moderate uncovertebral hypertrophy at C5-C6.  Scattered moderate facet arthropathy throughout the cervical spine. Upper chest: Negative. Other: None. IMPRESSION: 1. No acute intracranial abnormality. 2. No acute cervical spine fracture or traumatic listhesis. Electronically Signed   By: Obie Dredge M.D.   On: 09/29/2021 15:14   DG Pelvis Portable  Result Date: 09/29/2021 CLINICAL DATA:  Trauma. EXAM: PORTABLE PELVIS 1-2 VIEWS COMPARISON:  None. FINDINGS: There is no evidence of pelvic fracture or diastasis. No pelvic bone lesions are seen. IMPRESSION: Negative. Electronically Signed   By: Lupita Raider M.D.   On:  09/29/2021 14:37   DG Chest Port 1 View  Result Date: 09/29/2021 CLINICAL DATA:  Trauma. EXAM: PORTABLE CHEST 1 VIEW COMPARISON:  April 13, 2020. FINDINGS: Stable cardiomegaly. Both lungs are clear. The visualized skeletal structures are unremarkable. IMPRESSION: No active disease. Electronically Signed   By: Lupita RaiderJames  Green Jr M.D.   On: 09/29/2021 14:36    Procedures Procedures    Medications Ordered in ED Medications  ondansetron (ZOFRAN-ODT) disintegrating tablet 4 mg (4 mg Oral Given 09/29/21 1514)    ED Course/ Medical Decision Making/ A&P                           Medical Decision Making Amount and/or Complexity of Data Reviewed Labs: ordered. Radiology: ordered. ECG/medicine tests: ordered.  Risk Prescription drug management.   81 year old female with history of dementia, presenting from skilled nursing facility for an unwitnessed fall out of her bed.  Additional medical history includes anxiety, constipation, GERD, depression, rheumatoid arthritis.  Differential diagnosis includes ICH, concussion, C-spine injury, and/or extremity injury.  Estimated time fall was 1130.  She did endorse to staff that she struck her head.  She is not on any blood thinners.  She was initially complaining of a headache.  With EMS, she was endorsing full body aches and some nausea.  On arrival in the ED, she denies any areas of pain.   She does state that she feels fatigued and still has some mild nausea.  On physical exam, patient does not have any areas of deformity, wounds, or bruising.  She does have diffuse tenderness throughout her bilateral distal lower extremities, including her ankles and feet.  On chart review, patient has documented chronic pain in these areas.  Work-up was initiated to include lab work and CT of head and cervical spine.  Patient was given Zofran for symptomatic relief of nausea.  Patient social determinants of health negatively affected by dementia and placement in skilled nursing facility.  She remained hemodynamically stable and did not endorse any new symptoms while in the ED.  Initial imaging studies were negative for acute injuries.  Care of patient was signed out to oncoming ED provider.        Final Clinical Impression(s) / ED Diagnoses Final diagnoses:  Trauma  Renal insufficiency  Fall, initial encounter    Rx / DC Orders ED Discharge Orders     None         Gloris Manchesterixon, Gianella Chismar, MD 10/01/21 1020

## 2021-09-29 NOTE — ED Notes (Signed)
I left a message with the [facility] notifying them pt was coming back. I just got a voicemail.

## 2022-05-23 DEATH — deceased

## 2023-01-28 IMAGING — CT CT HEAD W/O CM
4 series · 15 of 47 positions shown, 17 images · non-contrast
Comparison: CT head dated April 13, 2020.

CLINICAL DATA: Unwitnessed fall.



[Series 3: head without · axial · non-contrast · 0.45mm/px · z∈[-90,+30]mm · 7 of 32 slices shown, 9 images]
[im 4/32  brain]
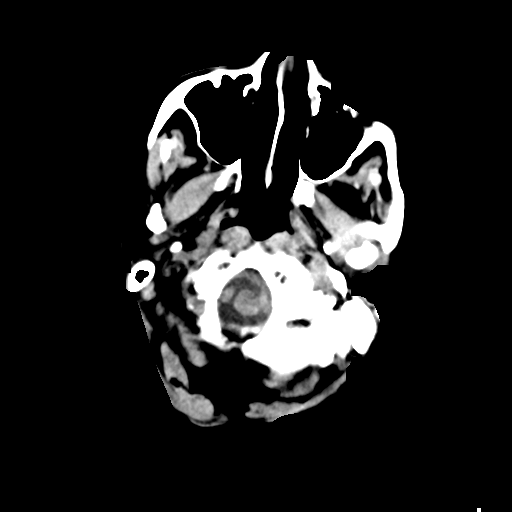
[im 4/32  bone]
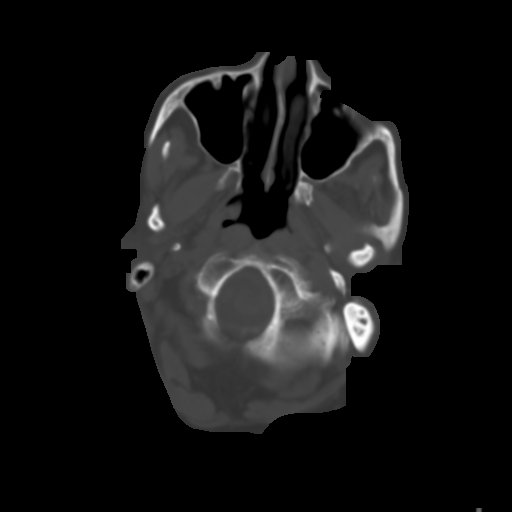
[im 8/32  brain]
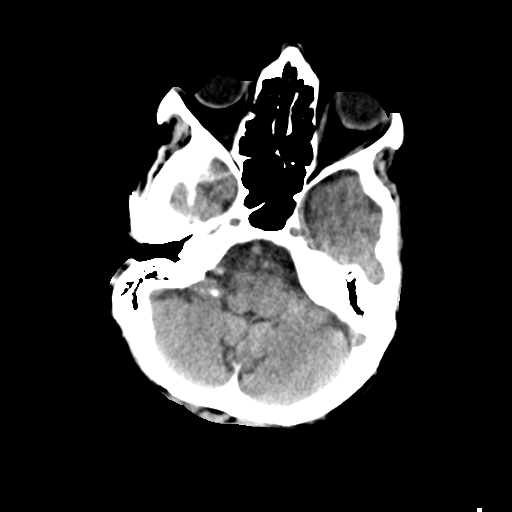
[im 12/32  brain]
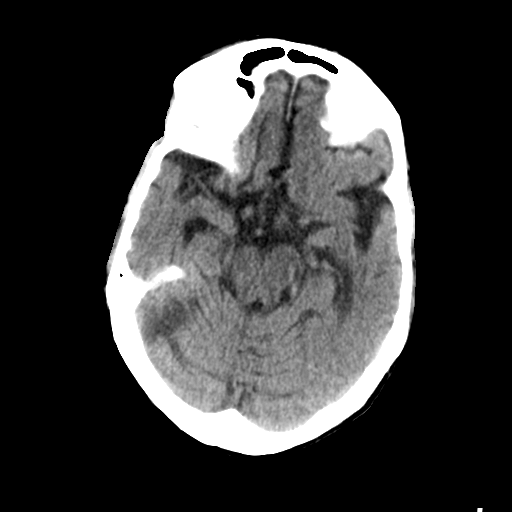
[im 16/32  brain]
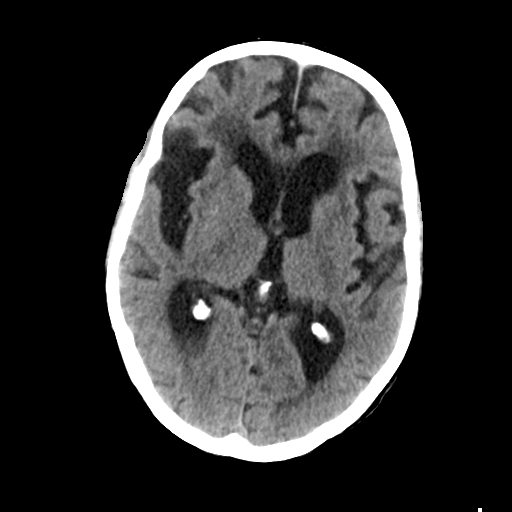
[im 20/32  brain]
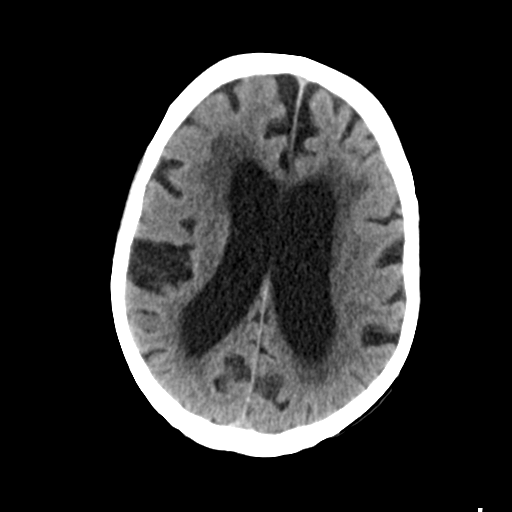
[im 20/32  bone]
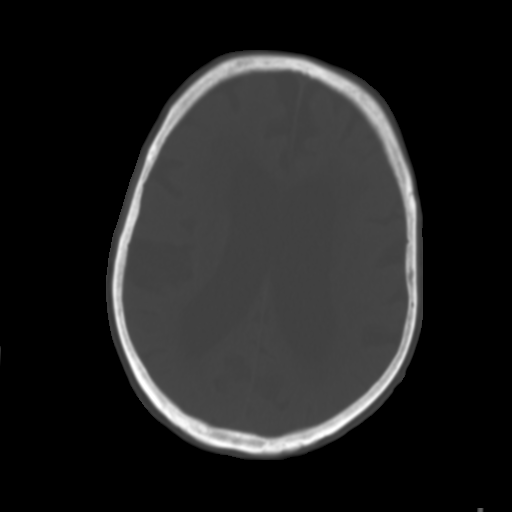
[im 24/32  brain]
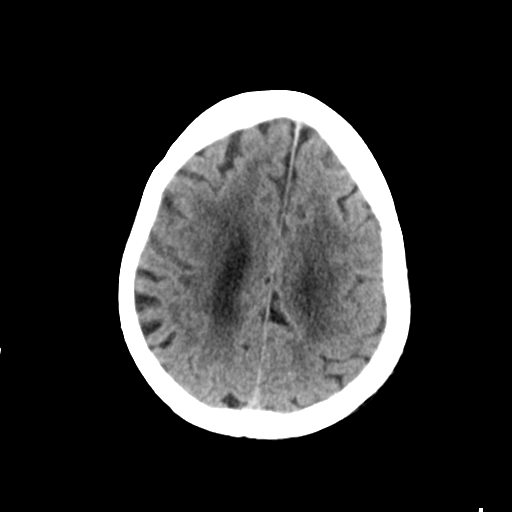
[im 28/32  brain]
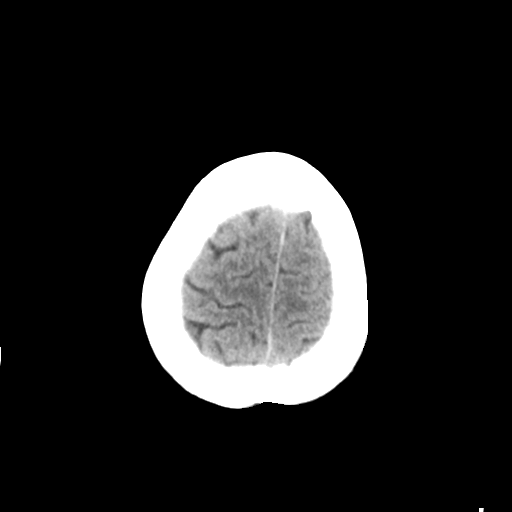

[Series 4: head bone · axial · 0.45mm/px · z∈[-91,-75]mm · 2 of 79 slices shown]
[im 8/79  bone]
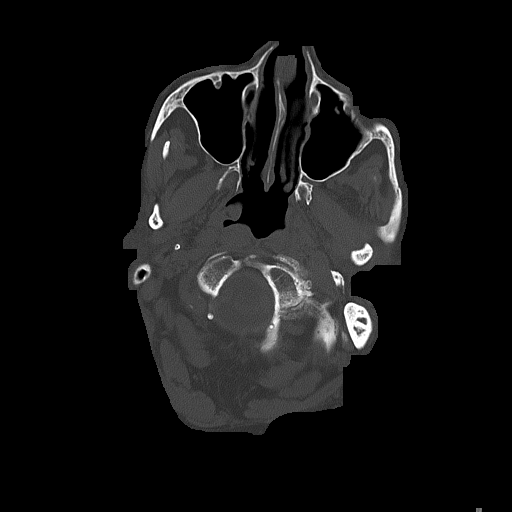
[im 16/79  bone]
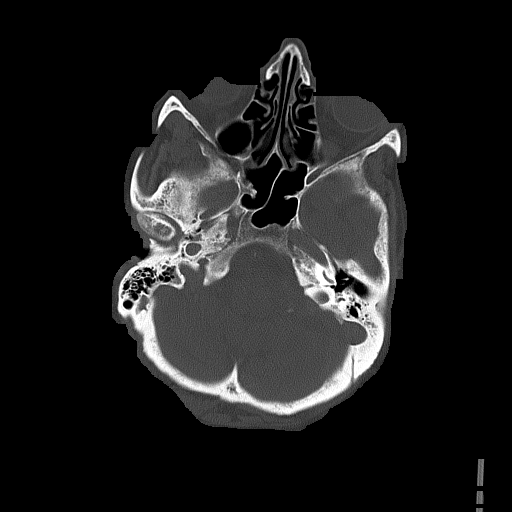

[Series 5: head without cor · coronal · non-contrast · 0.31mm/px · 3 of 72 slices shown]
[im 24/72  brain]
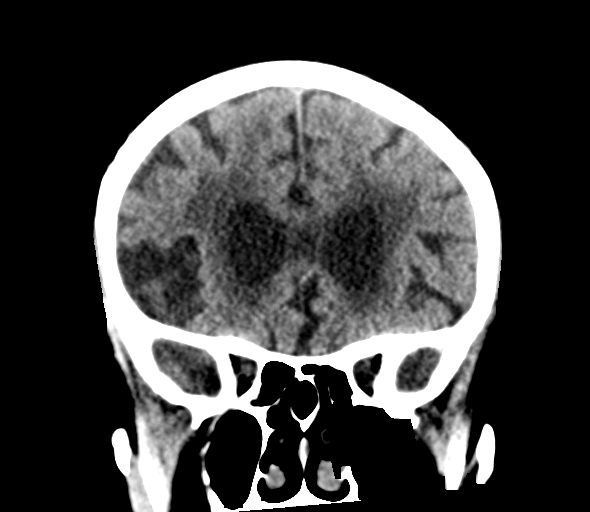
[im 32/72  brain]
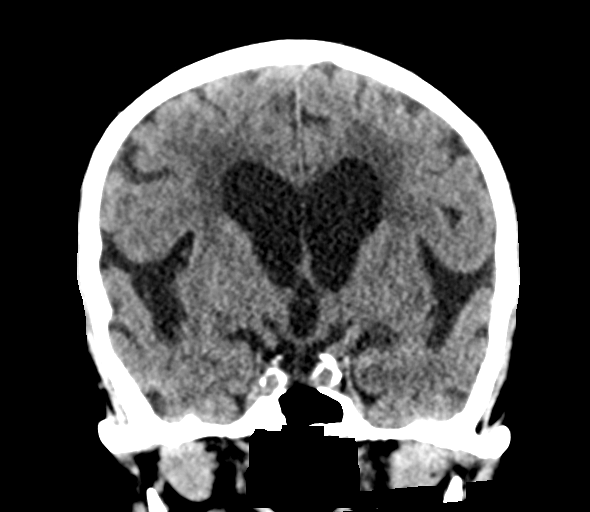
[im 40/72  brain]
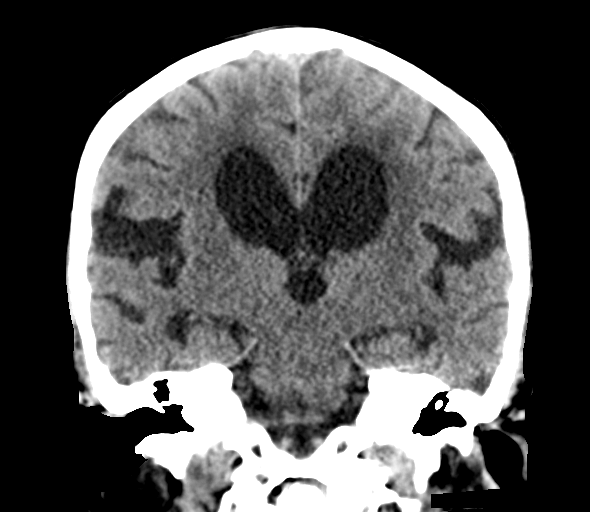

[Series 6: head without sag · sagittal · non-contrast · 0.29mm/px · 3 of 66 slices shown]
[im 22/66  brain]
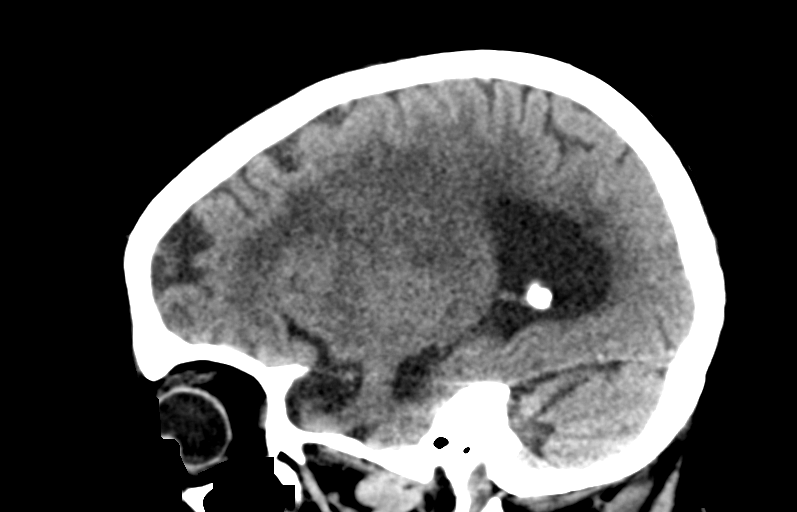
[im 33/66  brain]
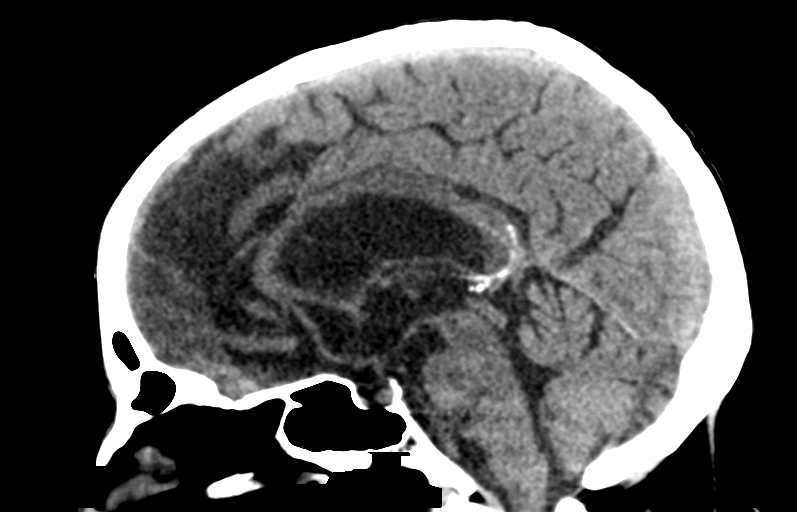
[im 44/66  brain]
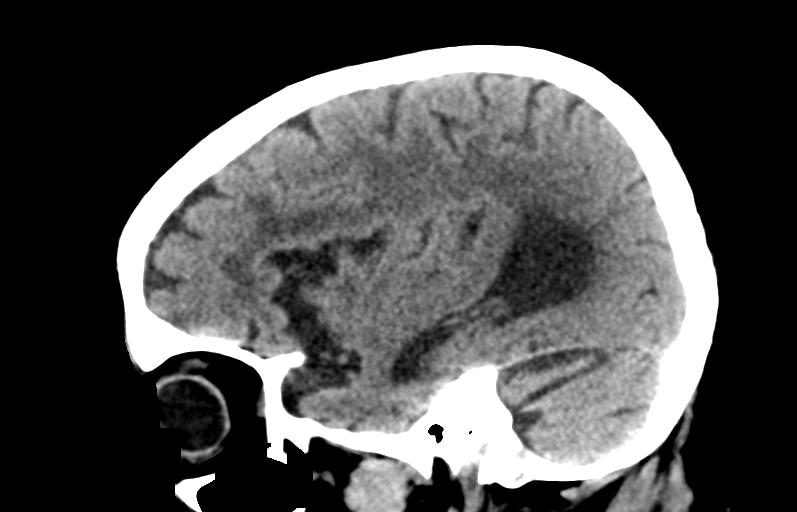

[15 of 47 positions shown; findings below may reference images not displayed]

FINDINGS: CT HEAD FINDINGS

Brain: No evidence of acute infarction, hemorrhage, hydrocephalus,
extra-axial collection or mass lesion/mass effect. Stable moderate
atrophy and chronic microvascular ischemic changes.

Vascular: Atherosclerotic vascular calcification of the carotid
siphons. No hyperdense vessel.

Skull: Normal. Negative for fracture or focal lesion.

Sinuses/Orbits: No acute finding.

Other: None.

CT CERVICAL SPINE FINDINGS

Alignment: No traumatic malalignment. Trace anterolisthesis at
C3-C4, C7-T1, and T1-T2.

Skull base and vertebrae: No acute fracture. No primary bone lesion
or focal pathologic process. Advanced left-sided atlanto-occipital
osteoarthritis.

Soft tissues and spinal canal: No prevertebral fluid or swelling. No
visible canal hematoma.

Disc levels: Small central disc protrusion and mild uncovertebral
hypertrophy at C3-C4. Severe disc height loss and moderate
uncovertebral hypertrophy at C5-C6. Scattered moderate facet
arthropathy throughout the cervical spine.

Upper chest: Negative.

Other: None.
IMPRESSION: 1. No acute intracranial abnormality.
2. No acute cervical spine fracture or traumatic listhesis.
